# Patient Record
Sex: Female | Born: 1965 | Race: White | Hispanic: No | Marital: Married | Smoking: Never smoker
Health system: Southern US, Community
[De-identification: ages and names within clinical notes are randomized; demographics above are authoritative.]

## PROBLEM LIST (undated history)

## (undated) DIAGNOSIS — F319 Bipolar disorder, unspecified: Secondary | ICD-10-CM

## (undated) DIAGNOSIS — I82409 Acute embolism and thrombosis of unspecified deep veins of unspecified lower extremity: Secondary | ICD-10-CM

## (undated) HISTORY — DX: Bipolar disorder, unspecified: F31.9

## (undated) HISTORY — DX: Acute embolism and thrombosis of unspecified deep veins of unspecified lower extremity: I82.409

---

## 2015-06-14 DIAGNOSIS — Z9119 Patient's noncompliance with other medical treatment and regimen: Secondary | ICD-10-CM | POA: Diagnosis not present

## 2015-06-14 DIAGNOSIS — D6851 Activated protein C resistance: Secondary | ICD-10-CM | POA: Diagnosis not present

## 2015-06-14 DIAGNOSIS — Z7901 Long term (current) use of anticoagulants: Secondary | ICD-10-CM | POA: Diagnosis not present

## 2015-06-14 DIAGNOSIS — F319 Bipolar disorder, unspecified: Secondary | ICD-10-CM | POA: Diagnosis not present

## 2015-07-25 DIAGNOSIS — Z7901 Long term (current) use of anticoagulants: Secondary | ICD-10-CM | POA: Diagnosis not present

## 2015-07-26 DIAGNOSIS — F314 Bipolar disorder, current episode depressed, severe, without psychotic features: Secondary | ICD-10-CM | POA: Diagnosis not present

## 2015-07-26 DIAGNOSIS — F4310 Post-traumatic stress disorder, unspecified: Secondary | ICD-10-CM | POA: Diagnosis not present

## 2015-08-01 DIAGNOSIS — Z7901 Long term (current) use of anticoagulants: Secondary | ICD-10-CM | POA: Diagnosis not present

## 2015-08-01 DIAGNOSIS — D6851 Activated protein C resistance: Secondary | ICD-10-CM | POA: Diagnosis not present

## 2015-08-12 DIAGNOSIS — Z86718 Personal history of other venous thrombosis and embolism: Secondary | ICD-10-CM | POA: Diagnosis not present

## 2015-08-12 DIAGNOSIS — F319 Bipolar disorder, unspecified: Secondary | ICD-10-CM | POA: Diagnosis not present

## 2015-08-12 DIAGNOSIS — D6851 Activated protein C resistance: Secondary | ICD-10-CM | POA: Diagnosis not present

## 2015-08-12 DIAGNOSIS — I82431 Acute embolism and thrombosis of right popliteal vein: Secondary | ICD-10-CM | POA: Diagnosis not present

## 2015-08-12 DIAGNOSIS — I82512 Chronic embolism and thrombosis of left femoral vein: Secondary | ICD-10-CM | POA: Diagnosis not present

## 2015-08-12 DIAGNOSIS — Z7901 Long term (current) use of anticoagulants: Secondary | ICD-10-CM | POA: Diagnosis not present

## 2015-08-12 DIAGNOSIS — Z6841 Body Mass Index (BMI) 40.0 and over, adult: Secondary | ICD-10-CM | POA: Diagnosis not present

## 2015-08-12 DIAGNOSIS — E669 Obesity, unspecified: Secondary | ICD-10-CM | POA: Diagnosis not present

## 2015-08-12 DIAGNOSIS — Z87891 Personal history of nicotine dependence: Secondary | ICD-10-CM | POA: Diagnosis not present

## 2015-09-16 DIAGNOSIS — D6851 Activated protein C resistance: Secondary | ICD-10-CM | POA: Diagnosis not present

## 2015-09-16 DIAGNOSIS — Z5181 Encounter for therapeutic drug level monitoring: Secondary | ICD-10-CM | POA: Diagnosis not present

## 2015-09-16 DIAGNOSIS — Z79811 Long term (current) use of aromatase inhibitors: Secondary | ICD-10-CM | POA: Diagnosis not present

## 2015-09-16 DIAGNOSIS — I87009 Postthrombotic syndrome without complications of unspecified extremity: Secondary | ICD-10-CM | POA: Diagnosis not present

## 2015-09-16 DIAGNOSIS — Z7901 Long term (current) use of anticoagulants: Secondary | ICD-10-CM | POA: Diagnosis not present

## 2015-09-16 DIAGNOSIS — I8290 Acute embolism and thrombosis of unspecified vein: Secondary | ICD-10-CM | POA: Diagnosis not present

## 2015-09-16 DIAGNOSIS — F319 Bipolar disorder, unspecified: Secondary | ICD-10-CM | POA: Diagnosis not present

## 2015-09-16 DIAGNOSIS — Z86718 Personal history of other venous thrombosis and embolism: Secondary | ICD-10-CM | POA: Diagnosis not present

## 2015-10-22 DIAGNOSIS — F4310 Post-traumatic stress disorder, unspecified: Secondary | ICD-10-CM | POA: Diagnosis not present

## 2015-10-22 DIAGNOSIS — F314 Bipolar disorder, current episode depressed, severe, without psychotic features: Secondary | ICD-10-CM | POA: Diagnosis not present

## 2015-11-15 DIAGNOSIS — F4310 Post-traumatic stress disorder, unspecified: Secondary | ICD-10-CM | POA: Diagnosis not present

## 2015-11-15 DIAGNOSIS — F314 Bipolar disorder, current episode depressed, severe, without psychotic features: Secondary | ICD-10-CM | POA: Diagnosis not present

## 2015-12-06 DIAGNOSIS — F314 Bipolar disorder, current episode depressed, severe, without psychotic features: Secondary | ICD-10-CM | POA: Diagnosis not present

## 2015-12-06 DIAGNOSIS — F4310 Post-traumatic stress disorder, unspecified: Secondary | ICD-10-CM | POA: Diagnosis not present

## 2015-12-25 DIAGNOSIS — I872 Venous insufficiency (chronic) (peripheral): Secondary | ICD-10-CM | POA: Diagnosis not present

## 2016-01-27 LAB — HEPATIC FUNCTION PANEL
ALK PHOS: 19 — AB (ref 25–125)
ALT: 21 (ref 7–35)
AST: 19 (ref 13–35)
Bilirubin, Total: 0.5

## 2016-01-27 LAB — VITAMIN D 25 HYDROXY (VIT D DEFICIENCY, FRACTURES): VIT D 25 HYDROXY: 42

## 2016-01-27 LAB — BASIC METABOLIC PANEL
BUN: 11 (ref 4–21)
Creatinine: 0.9 (ref 0.5–1.1)
GLUCOSE: 61
Potassium: 3.9 (ref 3.4–5.3)
SODIUM: 139 (ref 137–147)

## 2016-01-27 LAB — LIPID PANEL
CHOLESTEROL: 162 (ref 0–200)
HDL: 55 (ref 35–70)
LDL CALC: 98
LDl/HDL Ratio: 3
Triglycerides: 130 (ref 40–160)

## 2016-01-27 LAB — CBC AND DIFFERENTIAL
HEMATOCRIT: 39 (ref 36–46)
Hemoglobin: 13.1 (ref 12.0–16.0)
PLATELETS: 188 (ref 150–399)
WBC: 7.9

## 2016-01-27 LAB — TSH: TSH: 0.92 (ref 0.41–5.90)

## 2016-01-27 LAB — VITAMIN B12: Vitamin B-12: 477

## 2016-02-28 DIAGNOSIS — F314 Bipolar disorder, current episode depressed, severe, without psychotic features: Secondary | ICD-10-CM | POA: Diagnosis not present

## 2016-02-28 DIAGNOSIS — F4310 Post-traumatic stress disorder, unspecified: Secondary | ICD-10-CM | POA: Diagnosis not present

## 2016-11-26 DIAGNOSIS — F3131 Bipolar disorder, current episode depressed, mild: Secondary | ICD-10-CM | POA: Diagnosis not present

## 2016-11-26 DIAGNOSIS — F3111 Bipolar disorder, current episode manic without psychotic features, mild: Secondary | ICD-10-CM | POA: Diagnosis not present

## 2017-02-11 ENCOUNTER — Encounter: Payer: Self-pay | Admitting: Internal Medicine

## 2017-02-11 ENCOUNTER — Ambulatory Visit: Payer: BLUE CROSS/BLUE SHIELD | Admitting: Internal Medicine

## 2017-02-11 ENCOUNTER — Other Ambulatory Visit (INDEPENDENT_AMBULATORY_CARE_PROVIDER_SITE_OTHER): Payer: BLUE CROSS/BLUE SHIELD

## 2017-02-11 VITALS — BP 120/80 | HR 80 | Temp 98.3°F | Resp 16 | Ht 74.0 in | Wt 323.0 lb

## 2017-02-11 DIAGNOSIS — D6851 Activated protein C resistance: Secondary | ICD-10-CM

## 2017-02-11 DIAGNOSIS — D6859 Other primary thrombophilia: Secondary | ICD-10-CM

## 2017-02-11 DIAGNOSIS — I87099 Postthrombotic syndrome with other complications of unspecified lower extremity: Secondary | ICD-10-CM | POA: Insufficient documentation

## 2017-02-11 LAB — CBC WITH DIFFERENTIAL/PLATELET
BASOS PCT: 0.4 % (ref 0.0–3.0)
Basophils Absolute: 0 10*3/uL (ref 0.0–0.1)
EOS PCT: 0.1 % (ref 0.0–5.0)
Eosinophils Absolute: 0 10*3/uL (ref 0.0–0.7)
HEMATOCRIT: 39.9 % (ref 36.0–46.0)
HEMOGLOBIN: 12.9 g/dL (ref 12.0–15.0)
LYMPHS PCT: 16.1 % (ref 12.0–46.0)
Lymphs Abs: 1.1 10*3/uL (ref 0.7–4.0)
MCHC: 32.5 g/dL (ref 30.0–36.0)
MCV: 88.3 fl (ref 78.0–100.0)
MONO ABS: 0.4 10*3/uL (ref 0.1–1.0)
Monocytes Relative: 6.4 % (ref 3.0–12.0)
Neutro Abs: 5.1 10*3/uL (ref 1.4–7.7)
Neutrophils Relative %: 77 % (ref 43.0–77.0)
PLATELETS: 178 10*3/uL (ref 150.0–400.0)
RBC: 4.52 Mil/uL (ref 3.87–5.11)
RDW: 15.4 % (ref 11.5–15.5)
WBC: 6.7 10*3/uL (ref 4.0–10.5)

## 2017-02-11 LAB — PROTIME-INR
INR: 2.2 ratio — AB (ref 0.8–1.0)
Prothrombin Time: 23.1 s — ABNORMAL HIGH (ref 9.6–13.1)

## 2017-02-11 LAB — BASIC METABOLIC PANEL
BUN: 11 mg/dL (ref 6–23)
CHLORIDE: 109 meq/L (ref 96–112)
CO2: 26 meq/L (ref 19–32)
Calcium: 9.3 mg/dL (ref 8.4–10.5)
Creatinine, Ser: 0.94 mg/dL (ref 0.40–1.20)
GFR: 67.21 mL/min (ref >60.00)
Glucose, Bld: 88 mg/dL (ref 70–99)
Potassium: 3.8 mEq/L (ref 3.5–5.1)
Sodium: 141 mEq/L (ref 135–145)

## 2017-02-11 MED ORDER — RIVAROXABAN 20 MG PO TABS: 20.0000 mg | ORAL_TABLET | Freq: Every day | ORAL | 1 refills | Status: DC

## 2017-02-11 MED ORDER — APIXABAN 5 MG PO TABS: 5.0000 mg | ORAL_TABLET | Freq: Two times a day (BID) | ORAL | 1 refills | Status: DC

## 2017-02-11 NOTE — Progress Notes (Addendum)
Subjective:  Patient ID: Lynn [name] - [name], female    DOB: 09/24/1965  Age: 52 y.o. MRN: [id number]  CC: Coagulation Disorder  NEW TO ME  HPI Lynn [name] - Lynn [name] presents for establishing with a new PCP.  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  She was previously and successfully maintained on Eliquis but then over the last year Eliquis became a financial burden so she has been treated with warfarin.  She complains of the inconvenience of monitoring the INR with warfarin and wants to go back to Eliquis.  She thinks her new insurance this will year will cover Eliquis.  She has chronic swelling in her left lower extremity but says it is not any worse recently.  She denies lower extremity pain or claudication.  She has had no recent episodes of chest pain, shortness of breath, headache, easy bruising, or bleeding.  She has never had a mammogram or Pap smear.  She refuses all health maintenance interventions today such as vaccines, mammogram, and Pap smear.  History Lynn LeashDonna has a past medical history of Bipolar 1 disorder (HCC) and DVT (deep venous thrombosis) (HCC).   She has no past surgical history on file.   Her family history includes CVA in her mother; Depression in her sister; Diabetes in her mother and sister; Drug abuse in her sister; Hypertension in her father.She reports that  has never smoked. she has never used smokeless tobacco. She reports that she does not drink alcohol or use drugs.  Outpatient Medications Prior to Visit  Medication Sig Dispense Refill  . amantadine (SYMMETREL) 100 MG capsule Take by mouth.    . gabapentin (NEURONTIN) 600 MG tablet Take 600 mg by mouth daily.    Marland Kitchen. lithium carbonate (LITHOBID) 300 MG CR tablet Take by mouth.    . OLANZAPINE-FLUOXETINE HCL PO Take 15-50 mg by mouth.    . QUEtiapine (SEROQUEL) 100 MG tablet Take by mouth.    . zolpidem (AMBIEN) 10 MG tablet Take by mouth.    .  warfarin (COUMADIN) 5 MG tablet Take as directed at last coumadin check     No facility-administered medications prior to visit.     ROS Review of Systems  Constitutional: Negative.  Negative for fatigue and unexpected weight change.  HENT: Negative.   Eyes: Negative.  Negative for visual disturbance.  Respiratory: Negative.  Negative for cough, chest tightness, shortness of breath and wheezing.   Cardiovascular: Positive for leg swelling. Negative for chest pain and palpitations.       She has chronic, unchanged swelling in her left lower extremity.  There is no swelling in her right lower extremity.  Gastrointestinal: Negative for abdominal pain, blood in stool, constipation, diarrhea, nausea and vomiting.  Endocrine: Negative.   Genitourinary: Negative for hematuria and vaginal bleeding.  Musculoskeletal: Negative.   Skin: Negative.  Negative for pallor.  Allergic/Immunologic: Negative.   Neurological: Negative.  Negative for dizziness and weakness.  Hematological: Negative for adenopathy. Does not bruise/bleed easily.  Psychiatric/Behavioral: Negative.  Negative for dysphoric mood and suicidal ideas. The patient is not nervous/anxious.     Objective:  BP 120/80 (BP Location: Left Arm, Patient Position: Sitting, Cuff Size: Large)   Pulse 80   Temp 98.3 F (36.8 C) (Oral)   Resp 16   Ht 6\' 2"  (1.88 m)   Wt (!) 323 lb (146.5 kg)   LMP 02/09/2017   SpO2 97%   BMI  41.47 kg/m   Physical Exam  Constitutional: She is oriented to person, place, and time. No distress.  HENT:  Mouth/Throat: Oropharynx is clear and moist. No oropharyngeal exudate.  Eyes: Conjunctivae are normal. Left eye exhibits no discharge. No scleral icterus.  Neck: Normal range of motion. Neck supple. No JVD present. No thyromegaly present.  Cardiovascular: Normal rate, regular rhythm and normal heart sounds. Exam reveals no gallop.  No murmur heard. Pulmonary/Chest: Effort normal and breath sounds normal.  No respiratory distress. She has no wheezes. She has no rales.  Abdominal: Soft. Bowel sounds are normal. She exhibits no mass. There is no tenderness.  Musculoskeletal: Normal range of motion. She exhibits edema. She exhibits no tenderness.  ++ non-pitting edema in the LLE No edema in the RLE  Lymphadenopathy:    She has no cervical adenopathy.  Neurological: She is alert and oriented to person, place, and time.  Skin: Skin is warm and dry. No rash noted. She is not diaphoretic. No erythema. No pallor.  Psychiatric: She has a normal mood and affect. Her behavior is normal. Judgment and thought content normal.  Vitals reviewed.  Lab Results  Component Value Date   WBC 6.7 02/11/2017   HGB 12.9 02/11/2017   HCT 39.9 02/11/2017   PLT 178.0 02/11/2017   GLUCOSE 88 02/11/2017   CHOL 162 01/27/2016   TRIG 130 01/27/2016   HDL 55 01/27/2016   LDLCALC 98 01/27/2016   ALT 21 01/27/2016   AST 19 01/27/2016   NA 141 02/11/2017   K 3.8 02/11/2017   CL 109 02/11/2017   CREATININE 0.94 02/11/2017   BUN 11 02/11/2017   CO2 26 02/11/2017   TSH 0.92 01/27/2016   INR 2.2 (H) 02/11/2017     Assessment & Plan:   Biridiana was seen today for coagulation disorder.  Diagnoses and all orders for this visit:  Factor 5 Leiden mutation, heterozygous (HCC)- as below -     CBC with Differential/Platelet; Future -     Basic metabolic panel; Future -     Protime-INR; Future -     Discontinue: rivaroxaban (XARELTO) 20 MG TABS tablet; Take 1 tablet (20 mg total) by mouth daily with supper. -     apixaban (ELIQUIS) 5 MG TABS tablet; Take 1 tablet (5 mg total) by mouth 2 (two) times daily.  Primary hypercoagulable state (HCC)- she is currently anticoagulated with an INR of 2.2.  I have asked her to stop Coumadin and in 2 days to start Eliquis. -     CBC with Differential/Platelet; Future -     Basic metabolic panel; Future -     Protime-INR; Future -     Discontinue: rivaroxaban (XARELTO) 20 MG TABS  tablet; Take 1 tablet (20 mg total) by mouth daily with supper. -     apixaban (ELIQUIS) 5 MG TABS tablet; Take 1 tablet (5 mg total) by mouth 2 (two) times daily.  Postphlebitic syndrome with complication -     Discontinue: rivaroxaban (XARELTO) 20 MG TABS tablet; Take 1 tablet (20 mg total) by mouth daily with supper.   I have discontinued Arnetia Bronk - [name]'s warfarin and rivaroxaban. I am also having her start on apixaban. Additionally, I am having her maintain her amantadine, zolpidem, QUEtiapine, lithium carbonate, gabapentin, and OLANZAPINE-FLUOXETINE HCL PO.  Meds ordered this encounter  Medications  . DISCONTD: rivaroxaban (XARELTO) 20 MG TABS tablet    Sig: Take 1 tablet (20 mg total) by mouth daily with supper.  Dispense:  90 tablet    Refill:  1  . apixaban (ELIQUIS) 5 MG TABS tablet    Sig: Take 1 tablet (5 mg total) by mouth 2 (two) times daily.    Dispense:  180 tablet    Refill:  1     Follow-up: Return in about 6 months (around 08/11/2017).  Sanda Linger, MD

## 2017-02-11 NOTE — Patient Instructions (Signed)
Factor V Deficiency Factor V deficiency is a rare genetic condition that causes problems with the way your blood clots. This means that it is harder for your body to stop bleeding, especially after surgery or injury. Factor V is a protein in the blood that helps your blood to clot (blood coagulation factor). If you have a factor V deficiency, your body does not produce enough of this protein, or the protein does not work the way it should. Most cases of factor V deficiency are not severe. What are the causes? Factor V deficiency is almost always caused by a defective factor V gene passed down (inherited) from both parents. In rare cases, the body can develop an antibody that causes factor V deficiency. This can occur after certain medical procedures or after giving birth. It can also happen if you have an autoimmune disease or cancer. What increases the risk? You may be at higher risk for factor V deficiency if you have:  A family history of the condition.  A family history of bleeding disorders.  An autoimmune disease.  Certain cancers.  What are the signs or symptoms? Most symptoms of factor V deficiency involve heavy or abnormal bleeding. They include:  Abnormal bleeding after injury, surgery, or childbirth.  Bleeding under the skin.  Bleeding gums.  Frequent nosebleeds.  Bruising.  Menstrual periods that are unusually long and heavy.  Internal bleeding.  Bleeding from the umbilical cord at birth.  How is this diagnosed? Your health care provider may suspect factor V deficiency if you have symptoms of the condition as well as a family or personal history of bleeding problems. A physical exam will be done. Blood tests may also be done to help make a diagnosis. These tests may include:  Factor assays. These tests are used to check the level of certain clotting factors and how well they work.  Prothrombin time and partial prothrombin time. This test measures how long it takes  your blood to clot.  Inhibitor tests. These tests determine whether your body's immune system affects clotting factors.  How is this treated? Treatment for factor V deficiency may include the following:  Blood transfusions of fresh frozen plasma (FFP) may be done if you have severe bleeding. This may also be done as a precaution whenever you have surgery.  A nasal spray that raises factor levels (desmopressin) may be used before any medical procedures.  Birth control pills may be used to control heavy menstrual bleeding.  Follow these instructions at home:  Be extra careful to avoid injuries or accidents.  Take medicines only as directed by your health care provider.  Tell all of your health care providers that you have factor V deficiency, especially before having a medical or dental procedure.  Wear a medical alert bracelet in case of emergency.  Take steps at home to reduce the risk of bleeding, such as using a soft toothbrush and an Neurosurgeonelectric razor.  Include plenty of fiber in your diet to prevent constipation and reduce your risk for rectal bleeding.  Do not use enemas or rectal thermometers. Contact a health care provider if:  You have excessive or persistent bleeding.  Your skin bruises very easily.  Your menstrual periods are very heavy or last longer than normal. Get help right away if: You have unusual or severe blood loss. This information is not intended to replace advice given to you by your health care provider. Make sure you discuss any questions you have with your health care  provider. Document Released: 08/09/2013 Document Revised: 06/20/2015 Document Reviewed: 06/12/2013 Elsevier Interactive Patient Education  [employer]2018 Elsevier Inc.

## 2017-02-14 ENCOUNTER — Encounter: Payer: Self-pay | Admitting: Internal Medicine

## 2017-02-18 ENCOUNTER — Telehealth: Payer: Self-pay | Admitting: Internal Medicine

## 2017-02-18 DIAGNOSIS — D6859 Other primary thrombophilia: Secondary | ICD-10-CM

## 2017-02-18 DIAGNOSIS — D6851 Activated protein C resistance: Secondary | ICD-10-CM

## 2017-02-18 MED ORDER — APIXABAN 5 MG PO TABS: 5.0000 mg | ORAL_TABLET | Freq: Two times a day (BID) | ORAL | 5 refills | Status: DC

## 2017-02-18 NOTE — Telephone Encounter (Signed)
[facility]Raechel Chute/lmb

## 2017-02-18 NOTE — Telephone Encounter (Signed)
Copied from CRM [id number]. Topic: Quick Communication - See Telephone Encounter >> Feb 18, 2017 11:01 AM Eston Mouldavis, Valinda Fedie B wrote: CRM for notification. See Telephone encounter for:  PT states she needs rx for apixaban (ELIQUIS) 5 MG TABS table written  for 30 day supply  instead of 90 days   [facility] [address] - [place], [place] - [address] [phone] (Phone) [phone] (Fax)    02/18/17.

## 2017-05-27 DIAGNOSIS — F3131 Bipolar disorder, current episode depressed, mild: Secondary | ICD-10-CM | POA: Diagnosis not present

## 2017-07-01 DIAGNOSIS — F3176 Bipolar disorder, in full remission, most recent episode depressed: Secondary | ICD-10-CM | POA: Diagnosis not present

## 2017-07-01 DIAGNOSIS — F410 Panic disorder [episodic paroxysmal anxiety] without agoraphobia: Secondary | ICD-10-CM | POA: Diagnosis not present

## 2017-07-01 DIAGNOSIS — F3174 Bipolar disorder, in full remission, most recent episode manic: Secondary | ICD-10-CM | POA: Diagnosis not present

## 2017-11-28 ENCOUNTER — Other Ambulatory Visit: Payer: Self-pay | Admitting: Internal Medicine

## 2017-11-28 DIAGNOSIS — D6851 Activated protein C resistance: Secondary | ICD-10-CM

## 2017-11-28 DIAGNOSIS — D6859 Other primary thrombophilia: Secondary | ICD-10-CM

## 2017-12-30 DIAGNOSIS — F3181 Bipolar II disorder: Secondary | ICD-10-CM | POA: Diagnosis not present

## 2017-12-30 DIAGNOSIS — F3174 Bipolar disorder, in full remission, most recent episode manic: Secondary | ICD-10-CM | POA: Diagnosis not present

## 2017-12-30 DIAGNOSIS — F410 Panic disorder [episodic paroxysmal anxiety] without agoraphobia: Secondary | ICD-10-CM | POA: Diagnosis not present

## 2018-01-24 ENCOUNTER — Encounter: Payer: Self-pay | Admitting: Internal Medicine

## 2018-01-24 ENCOUNTER — Ambulatory Visit: Payer: BLUE CROSS/BLUE SHIELD | Admitting: Internal Medicine

## 2018-01-24 ENCOUNTER — Ambulatory Visit: Payer: Self-pay

## 2018-01-24 ENCOUNTER — Ambulatory Visit (INDEPENDENT_AMBULATORY_CARE_PROVIDER_SITE_OTHER)
Admission: RE | Admit: 2018-01-24 | Discharge: 2018-01-24 | Disposition: A | Discharge status: Home / Self Care | Payer: BLUE CROSS/BLUE SHIELD | Source: Ambulatory Visit | Attending: Internal Medicine | Admitting: Internal Medicine

## 2018-01-24 ENCOUNTER — Other Ambulatory Visit (INDEPENDENT_AMBULATORY_CARE_PROVIDER_SITE_OTHER): Payer: BLUE CROSS/BLUE SHIELD

## 2018-01-24 ENCOUNTER — Other Ambulatory Visit: Payer: Self-pay | Admitting: Internal Medicine

## 2018-01-24 VITALS — BP 118/60 | HR 99 | Temp 98.4°F | Resp 16 | Ht 74.0 in | Wt 283.0 lb

## 2018-01-24 DIAGNOSIS — R059 Cough, unspecified: Secondary | ICD-10-CM

## 2018-01-24 DIAGNOSIS — J181 Lobar pneumonia, unspecified organism: Secondary | ICD-10-CM | POA: Diagnosis not present

## 2018-01-24 DIAGNOSIS — J189 Pneumonia, unspecified organism: Secondary | ICD-10-CM

## 2018-01-24 DIAGNOSIS — D6859 Other primary thrombophilia: Secondary | ICD-10-CM

## 2018-01-24 DIAGNOSIS — R05 Cough: Secondary | ICD-10-CM

## 2018-01-24 LAB — CBC WITH DIFFERENTIAL/PLATELET
Basophils Absolute: 0.1 10*3/uL (ref 0.0–0.1)
Basophils Relative: 0.7 % (ref 0.0–3.0)
Eosinophils Absolute: 0.1 10*3/uL (ref 0.0–0.7)
Eosinophils Relative: 0.9 % (ref 0.0–5.0)
HEMATOCRIT: 41.6 % (ref 36.0–46.0)
Hemoglobin: 13.5 g/dL (ref 12.0–15.0)
LYMPHS ABS: 1.5 10*3/uL (ref 0.7–4.0)
LYMPHS PCT: 10.8 % — AB (ref 12.0–46.0)
MCHC: 32.6 g/dL (ref 30.0–36.0)
MCV: 87 fl (ref 78.0–100.0)
MONOS PCT: 6.6 % (ref 3.0–12.0)
Monocytes Absolute: 0.9 10*3/uL (ref 0.1–1.0)
NEUTROS ABS: 11.2 10*3/uL — AB (ref 1.4–7.7)
NEUTROS PCT: 81 % — AB (ref 43.0–77.0)
PLATELETS: 300 10*3/uL (ref 150.0–400.0)
RBC: 4.78 Mil/uL (ref 3.87–5.11)
RDW: 14.4 % (ref 11.5–15.5)
WBC: 13.8 10*3/uL — ABNORMAL HIGH (ref 4.0–10.5)

## 2018-01-24 LAB — BASIC METABOLIC PANEL
BUN: 14 mg/dL (ref 6–23)
CO2: 24 mEq/L (ref 19–32)
CREATININE: 1.27 mg/dL — AB (ref 0.40–1.20)
Calcium: 10.5 mg/dL (ref 8.4–10.5)
Chloride: 103 mEq/L (ref 96–112)
GFR: 47.31 mL/min — ABNORMAL LOW (ref >60.00)
Glucose, Bld: 104 mg/dL — ABNORMAL HIGH (ref 70–99)
Potassium: 4 mEq/L (ref 3.5–5.1)
Sodium: 137 mEq/L (ref 135–145)

## 2018-01-24 MED ORDER — AZITHROMYCIN 500 MG PO TABS: 500.0000 mg | ORAL_TABLET | Freq: Every day | ORAL | 0 refills | Status: AC

## 2018-01-24 MED ORDER — LEVOFLOXACIN 750 MG PO TABS: 750.0000 mg | ORAL_TABLET | Freq: Every day | ORAL | 0 refills | Status: DC

## 2018-01-24 MED ORDER — PROMETHAZINE HCL 12.5 MG PO TABS: 12.5000 mg | ORAL_TABLET | Freq: Four times a day (QID) | ORAL | 0 refills | Status: DC | PRN

## 2018-01-24 MED ORDER — CEFDINIR 300 MG PO CAPS: 300.0000 mg | ORAL_CAPSULE | Freq: Two times a day (BID) | ORAL | 0 refills | Status: AC

## 2018-01-24 MED ORDER — HYDROCODONE-HOMATROPINE 5-1.5 MG/5ML PO SYRP: 5.0000 mL | ORAL_SOLUTION | Freq: Three times a day (TID) | ORAL | 0 refills | Status: DC | PRN

## 2018-01-24 MED ORDER — PROMETHAZINE-DM 6.25-15 MG/5ML PO SYRP: 5.0000 mL | ORAL_SOLUTION | Freq: Four times a day (QID) | ORAL | 0 refills | Status: DC | PRN

## 2018-01-24 NOTE — Telephone Encounter (Signed)
    1  Lynn [name] [facility]- [name] Female, 52 y.o., 12/07/1965 MRN:  [id number] Phone:  [phone] [name](M) PCP:  Etta GrandchildJones, Thomas L, MD Coverage:  Valinda HoarBLUE CROSS BLUE SHIELD/BCBS OTHER Next Appt With Internal Medicine 02/17/2018 at 2:30 PM Message from Darron DoomKaren A Yeudiel Mateo sent at 01/24/2018 11:31 AM EST   Tresa EndoKelly with [facility] called to say that there is a drug interaction warning with levofloxacin (LEVAQUIN) 750 MG tablet and QUEtiapine (SEROQUEL) 100 MG tablet. Also request an alternative for promethazine-dextromethorphan (PROMETHAZINE-DM) 6.25-15 MG/5ML syrup medication is not available to be ordered anywhere. Please advise Ph# [phone]  Call History    Type Contact Phone User  01/24/2018 11:27 AM Phone (Incoming) [facility] [address] - [place], [place] - [address] (Pharmacy) [phone] Darron Doomavis, Karen A

## 2018-01-24 NOTE — Progress Notes (Signed)
Subjective:  Patient ID: Lynn Lynn [name], female    DOB: 1965-09-19  Age: 52 y.o. MRN: [id number]  CC: Cough   HPI Lynn Lynn [name] presents for a 5-day history of cough productive of thick green mucus.  She iha also had night sweats and a few episodes of nausea and diarrhea.  Outpatient Medications Prior to Visit  Medication Sig Dispense Refill  . Lynn [name] (SYMMETREL) 100 MG capsule Take by mouth.    Lynn Lynn [name] 5 MG TABS tablet TAKE ONE TABLET BY MOUTH TWICE A DAY 60 tablet 4  . Lynn Lynn [name] (NEURONTIN) 600 MG tablet Take 600 mg by mouth daily.    Marland Kitchen Lynn Lynn [name] (LITHOBID) 300 MG CR tablet Take by mouth.    Marland Kitchen LORazepam (ATIVAN) 1 MG tablet Take 1 mg by mouth 2 (two) times daily.    . Lynn Lynn [name] (LATUDA) 60 MG TABS Take 1 tablet by mouth daily.    . Lynn Lynn [name] (SEROQUEL) 100 MG tablet Take by mouth.    . Lynn Lynn [name] (AMBIEN) 10 MG tablet Take by mouth.    . Lynn Lynn [name] Lynn [name] Take 15-50 mg by mouth.     No facility-administered medications prior to visit.     ROS Review of Systems  Constitutional: Positive for chills and fever. Negative for diaphoresis and fatigue.  HENT: Positive for sore throat. Negative for trouble swallowing.   Respiratory: Positive for cough and shortness of breath. Negative for chest tightness, wheezing and stridor.   Cardiovascular: Negative for chest pain, palpitations and leg swelling.  Gastrointestinal: Positive for diarrhea and nausea. Negative for abdominal pain.  Endocrine: Negative.   Genitourinary: Negative.   Musculoskeletal: Negative.  Negative for arthralgias and myalgias.  Skin: Negative.  Negative for color change and rash.  Neurological: Negative.  Negative for dizziness, weakness and headaches.  Hematological: Negative for adenopathy. Does not bruise/bleed easily.  Psychiatric/Behavioral: Negative.     Objective:  BP 118/60   Pulse 99   Temp 98.4 F (36.9 C) (Oral)   Resp 16   Ht 6\' 2"  (1.88 m)   Wt  283 lb (128.4 kg)   LMP 01/02/2018   SpO2 96%   BMI 36.34 kg/m   BP Readings from Last 3 Encounters:  01/24/18 118/60  02/11/17 120/80    Wt Readings from Last 3 Encounters:  01/24/18 283 lb (128.4 kg)  02/11/17 (!) 323 lb (146.5 kg)    Physical Exam Vitals signs reviewed.  Constitutional:      General: She is not in acute distress.    Appearance: Normal appearance. She is not ill-appearing, toxic-appearing or diaphoretic.  HENT:     Nose: Nose normal. No congestion.     Mouth/Throat:     Mouth: Mucous membranes are moist.     Pharynx: No posterior oropharyngeal erythema.  Eyes:     Conjunctiva/sclera: Conjunctivae normal.  Neck:     Musculoskeletal: Normal range of motion and neck supple. No neck rigidity.  Cardiovascular:     Rate and Rhythm: Normal rate and regular rhythm.     Pulses: Normal pulses.     Heart sounds: Normal heart sounds. No murmur. No gallop.   Pulmonary:     Effort: Pulmonary effort is normal.     Breath sounds: Normal breath sounds. No stridor. No wheezing, rhonchi or rales.  Abdominal:     General: Abdomen is flat.     Palpations: There is no hepatomegaly, splenomegaly or mass.     Tenderness: There is no abdominal  tenderness.  Musculoskeletal: Normal range of motion.        General: No tenderness or deformity.  Lymphadenopathy:     Cervical: No cervical adenopathy.  Skin:    General: Skin is warm and dry.  Neurological:     General: No focal deficit present.     Mental Status: She is alert and oriented to person, place, and time.  Psychiatric:        Mood and Affect: Mood normal.     Lab Results  Component Value Date   WBC 13.8 (H) 01/24/2018   HGB 13.5 01/24/2018   HCT 41.6 01/24/2018   PLT 300.0 01/24/2018   GLUCOSE 104 (H) 01/24/2018   CHOL 162 01/27/2016   TRIG 130 01/27/2016   HDL 55 01/27/2016   LDLCALC 98 01/27/2016   ALT 21 01/27/2016   AST 19 01/27/2016   NA 137 01/24/2018   K 4.0 01/24/2018   CL 103 01/24/2018    CREATININE 1.27 (H) 01/24/2018   BUN 14 01/24/2018   CO2 24 01/24/2018   TSH 0.92 01/27/2016   INR 2.2 (H) 02/11/2017    Dg Chest 2 View  Result Date: 01/24/2018 CLINICAL DATA:  52 year old with cough. EXAM: CHEST - 2 VIEW COMPARISON:  None. FINDINGS: Patchy densities in the left lung, particularly left upper lobe. Findings are suggestive for pneumonia. The right lung is clear. Heart and mediastinum are within normal limits. No pleural effusions. Degenerative endplate changes in the thoracic spine. Trachea is midline. IMPRESSION: Patchy densities in left lung and most compatible with pneumonia. Followup PA and lateral chest X-ray is recommended in 3-4 weeks following trial of antibiotic therapy to ensure resolution and exclude underlying malignancy. Electronically Signed   By: Richarda OverlieAdam  Henn M.D.   On: 01/24/2018 11:07    Assessment & Plan:   Lynn Lynn [name] was seen today for cough.  Diagnoses and all orders for this visit:  Primary hypercoagulable state (HCC)- Her creatinine clearance is normal at 82.  She will remain on the current dose of Lynn Lynn [name]. -     Basic metabolic panel; Future -     CBC with Differential/Platelet; Future  Cough- Her chest x-ray is positive for infiltrates on the left side. -     DG Chest 2 View; Future  Pneumonia of lower lobe due to infectious organism, unspecified laterality (HCC)- Lynn will treat her for typical and atypical causes with levofloxacin.  Will control her symptoms with Promethazine DM as needed. -     levofloxacin (LEVAQUIN) 750 MG tablet; Take 1 tablet (750 mg total) by mouth daily for 7 days. -     promethazine-dextromethorphan (PROMETHAZINE-DM) 6.25-15 MG/5ML syrup; Take 5 mLs by mouth 4 (four) times daily as needed for up to 7 days for cough.   Lynn am having Lynn Lynn [name] start on levofloxacin and promethazine-dextromethorphan. Lynn am also having her maintain her Lynn [name], Lynn Lynn [name], Lynn Lynn [name], Lynn Lynn [name], Lynn Lynn [name], Lynn Lynn [name], Lynn Lynn [name], Lynn Lynn [name], and LORazepam.  Meds ordered this encounter  Medications  . levofloxacin (LEVAQUIN) 750 MG tablet    Sig: Take 1 tablet (750 mg total) by mouth daily for 7 days.    Dispense:  7 tablet    Refill:  0  . promethazine-dextromethorphan (PROMETHAZINE-DM) 6.25-15 MG/5ML syrup    Sig: Take 5 mLs by mouth 4 (four) times daily as needed for up to 7 days for cough.    Dispense:  240 mL    Refill:  0     Follow-up: Return  in about 3 weeks (around 02/14/2018).  Sanda Lingerhomas Bay Jarquin, MD

## 2018-01-24 NOTE — Telephone Encounter (Signed)
Routing to dr jones, please advise, thanks 

## 2018-01-24 NOTE — Patient Instructions (Addendum)
Community-Acquired Pneumonia, Adult  Pneumonia is an infection of the lungs. It causes swelling in the airways of the lungs. Mucus and fluid may also build up inside the airways.  One type of pneumonia can happen while a person is in a hospital. A different type can happen when a person is not in a hospital (community-acquired pneumonia).   What are the causes?    This condition is caused by germs (viruses, bacteria, or fungi). Some types of germs can be passed from one person to another. This can happen when you breathe in droplets from the cough or sneeze of an infected person.  What increases the risk?  You are more likely to develop this condition if you:   Have a long-term (chronic) disease, such as:  ? Chronic obstructive pulmonary disease (COPD).  ? Asthma.  ? Cystic fibrosis.  ? Congestive heart failure.  ? Diabetes.  ? Kidney disease.   Have HIV.   Have sickle cell disease.   Have had your spleen removed.   Do not take good care of your teeth and mouth (poor dental hygiene).   Have a medical condition that increases the risk of breathing in droplets from your own mouth and nose.   Have a weakened body defense system (immune system).   Are a smoker.   Travel to areas where the germs that cause this illness are common.   Are around certain animals or the places they live.  What are the signs or symptoms?   A dry cough.   A wet (productive) cough.   Fever.   Sweating.   Chest pain. This often happens when breathing deeply or coughing.   Fast breathing or trouble breathing.   Shortness of breath.   Shaking chills.   Feeling tired (fatigue).   Muscle aches.  How is this treated?  Treatment for this condition depends on many things. Most adults can be treated at home. In some cases, treatment must happen in a hospital. Treatment may include:   Medicines given by mouth or through an IV tube.   Being given extra oxygen.   Respiratory therapy.  In rare cases, treatment for very bad pneumonia  may include:   Using a machine to help you breathe.   Having a procedure to remove fluid from around your lungs.  Follow these instructions at home:  Medicines   Take over-the-counter and prescription medicines only as told by your doctor.  ? Only take cough medicine if you are losing sleep.   If you were prescribed an antibiotic medicine, take it as told by your doctor. Do not stop taking the antibiotic even if you start to feel better.  General instructions     Sleep with your head and neck raised (elevated). You can do this by sleeping in a recliner or by putting a few pillows under your head.   Rest as needed. Get at least 8 hours of sleep each night.   Drink enough water to keep your pee (urine) pale yellow.   Eat a healthy diet that includes plenty of vegetables, fruits, whole grains, low-fat dairy products, and lean protein.   Do not use any products that contain nicotine or tobacco. These include cigarettes, e-cigarettes, and chewing tobacco. If you need help quitting, ask your doctor.   Keep all follow-up visits as told by your doctor. This is important.  How is this prevented?  A shot (vaccine) can help prevent pneumonia. Shots are often suggested for:   People   older than 50 years of age.   People older than 50 years of age who:  ? Are having cancer treatment.  ? Have long-term (chronic) lung disease.  ? Have problems with their body's defense system.  You may also prevent pneumonia if you take these actions:   Get the flu (influenza) shot every year.   Go to the dentist as often as told.   Wash your hands often. If you cannot use soap and water, use hand sanitizer.  Contact a doctor if:   You have a fever.   You lose sleep because your cough medicine does not help.  Get help right away if:   You are short of breath and it gets worse.   You have more chest pain.   Your sickness gets worse. This is very serious if:  ? You are an older adult.  ? Your body's defense system is weak.   You  cough up blood.  Summary   Pneumonia is an infection of the lungs.   Most adults can be treated at home. Some will need treatment in a hospital.   Drink enough water to keep your pee pale yellow.   Get at least 8 hours of sleep each night.  This information is not intended to replace advice given to you by your health care provider. Make sure you discuss any questions you have with your health care provider.  Document Released: 07/01/2007 Document Revised: 09/09/2017 Document Reviewed: 09/09/2017  Elsevier Interactive Patient Education  2019 Elsevier Inc.

## 2018-02-17 ENCOUNTER — Encounter: Payer: Self-pay | Admitting: Internal Medicine

## 2018-02-17 ENCOUNTER — Ambulatory Visit: Payer: BLUE CROSS/BLUE SHIELD | Admitting: Internal Medicine

## 2018-02-17 ENCOUNTER — Ambulatory Visit (INDEPENDENT_AMBULATORY_CARE_PROVIDER_SITE_OTHER)
Admission: RE | Admit: 2018-02-17 | Discharge: 2018-02-17 | Disposition: A | Discharge status: Home / Self Care | Payer: BLUE CROSS/BLUE SHIELD | Source: Ambulatory Visit | Attending: Internal Medicine | Admitting: Internal Medicine

## 2018-02-17 VITALS — BP 110/70 | HR 85 | Temp 98.6°F | Resp 16 | Ht 74.0 in | Wt 286.0 lb

## 2018-02-17 DIAGNOSIS — J181 Lobar pneumonia, unspecified organism: Secondary | ICD-10-CM

## 2018-02-17 DIAGNOSIS — J189 Pneumonia, unspecified organism: Secondary | ICD-10-CM

## 2018-02-17 NOTE — Progress Notes (Signed)
Subjective:  Patient ID: Lynn [name], female    DOB: Jul 23, 1965  Age: 53 y.o. MRN: [id number]  CC: Follow-up   HPI Lynn [name] presents for f/up - She has completed the antibiotic regimen for treatment of pneumonia.  She tells me that all of her symptoms have resolved.  Outpatient Medications Prior to Visit  Medication Sig Dispense Refill  . amantadine (SYMMETREL) 100 MG capsule Take by mouth.    Lynn [name] 5 MG TABS tablet TAKE ONE TABLET BY MOUTH TWICE A DAY 60 tablet 4  . gabapentin (NEURONTIN) 600 MG tablet Take 600 mg by mouth daily.    Lynn [name] Kitchen lithium carbonate (LITHOBID) 300 MG CR tablet Take by mouth.    Lynn [name] Kitchen LORazepam (ATIVAN) 1 MG tablet Take 1 mg by mouth 2 (two) times daily.    Lynn [name] Kitchen OLANZAPINE-FLUOXETINE HCL PO Take 15-50 mg by mouth.    . QUEtiapine (SEROQUEL) 100 MG tablet Take by mouth.    Lynn [name] Kitchen HYDROcodone-homatropine (HYCODAN) 5-1.5 MG/5ML syrup Take 5 mLs by mouth every 8 (eight) hours as needed for cough. 120 mL 0  . Lurasidone HCl (LATUDA) 60 MG TABS Take 1 tablet by mouth daily.    . promethazine (PHENERGAN) 12.5 MG tablet Take 1 tablet (12.5 mg total) by mouth every 6 (six) hours as needed for nausea or vomiting. 20 tablet 0  . zolpidem (AMBIEN) 10 MG tablet Take by mouth.     No facility-administered medications prior to visit.     ROS Review of Systems  Constitutional: Negative for chills, fatigue and fever.  HENT: Negative.  Negative for sore throat.   Respiratory: Negative for cough, chest tightness, shortness of breath and wheezing.   Cardiovascular: Negative for chest pain, palpitations and leg swelling.  Gastrointestinal: Negative for abdominal pain, constipation, diarrhea, nausea and vomiting.  Endocrine: Negative.   Genitourinary: Negative.   Musculoskeletal: Negative.   Skin: Negative for color change and rash.  Neurological: Negative.  Negative for dizziness and weakness.  Hematological: Negative for adenopathy. Does not bruise/bleed  easily.  Psychiatric/Behavioral: Negative.     Objective:  BP 110/70 (BP Location: Left Arm, Patient Position: Sitting, Cuff Size: Large)   Pulse 85   Temp 98.6 F (37 C) (Oral)   Ht 6\' 2"  (1.88 m)   Wt 286 lb (129.7 kg)   LMP 02/08/2018   SpO2 98%   BMI 36.72 kg/m   BP Readings from Last 3 Encounters:  02/17/18 110/70  01/24/18 118/60  02/11/17 120/80    Wt Readings from Last 3 Encounters:  02/17/18 286 lb (129.7 kg)  01/24/18 283 lb (128.4 kg)  02/11/17 (!) 323 lb (146.5 kg)    Physical Exam Vitals signs reviewed.  Constitutional:      General: She is not in acute distress.    Appearance: She is obese. She is not ill-appearing, toxic-appearing or diaphoretic.  HENT:     Nose: No congestion or rhinorrhea.     Mouth/Throat:     Mouth: Mucous membranes are moist.     Pharynx: Oropharynx is clear. No oropharyngeal exudate.  Eyes:     General: No scleral icterus.    Conjunctiva/sclera: Conjunctivae normal.  Neck:     Musculoskeletal: Normal range of motion and neck supple. No neck rigidity.  Cardiovascular:     Rate and Rhythm: Normal rate and regular rhythm.     Heart sounds: No murmur. No gallop.   Pulmonary:     Effort: Pulmonary effort is normal.  Breath sounds: No stridor. No wheezing, rhonchi or rales.  Abdominal:     General: Bowel sounds are normal.     Palpations: There is no hepatomegaly, splenomegaly or mass.     Tenderness: There is no abdominal tenderness. There is no guarding.     Hernia: No hernia is present.  Musculoskeletal: Normal range of motion.  Lymphadenopathy:     Cervical: No cervical adenopathy.  Skin:    General: Skin is warm and dry.     Findings: No rash.     Lab Results  Component Value Date   WBC 13.8 (H) 01/24/2018   HGB 13.5 01/24/2018   HCT 41.6 01/24/2018   PLT 300.0 01/24/2018   GLUCOSE 104 (H) 01/24/2018   CHOL 162 01/27/2016   TRIG 130 01/27/2016   HDL 55 01/27/2016   LDLCALC 98 01/27/2016   ALT 21  01/27/2016   AST 19 01/27/2016   NA 137 01/24/2018   K 4.0 01/24/2018   CL 103 01/24/2018   CREATININE 1.27 (H) 01/24/2018   BUN 14 01/24/2018   CO2 24 01/24/2018   TSH 0.92 01/27/2016   INR 2.2 (H) 02/11/2017   Dg Chest 2 View  Result Date: 02/17/2018 CLINICAL DATA:  Follow-up pneumonia. EXAM: CHEST - 2 VIEW COMPARISON:  01/24/2018 FINDINGS: Lungs are adequately inflated and otherwise clear. Interval resolution of the previous seen patchy left lung airspace process. Cardiomediastinal silhouette and remainder of the exam is unchanged. IMPRESSION: Resolution of patchy left lung airspace process.  No acute findings. Electronically Signed   By: Elberta Fortis M.D.   On: 02/17/2018 14:28    Assessment & Plan:   Lynn [name] was seen today for follow-up.  Diagnoses and all orders for this visit:  Pneumonia of left lower lobe due to infectious organism [facility] [place])- Based on her symptoms, exam, and chest x-ray this has resolved. -     DG Chest 2 View; Future   I have discontinued Lynn [name] - Lynn [name]'s zolpidem, Lurasidone HCl, promethazine, and HYDROcodone-homatropine. I am also having her maintain her amantadine, QUEtiapine, lithium carbonate, gabapentin, OLANZAPINE-FLUOXETINE HCL PO, ELIQUIS, and LORazepam.  No orders of the defined types were placed in this encounter.    Follow-up: No follow-ups on file.  Sanda Linger, MD

## 2018-02-17 NOTE — Patient Instructions (Signed)
Community-Acquired Pneumonia, Adult  Pneumonia is an infection of the lungs. It causes swelling in the airways of the lungs. Mucus and fluid may also build up inside the airways.  One type of pneumonia can happen while a person is in a hospital. A different type can happen when a person is not in a hospital (community-acquired pneumonia).   What are the causes?    This condition is caused by germs (viruses, bacteria, or fungi). Some types of germs can be passed from one person to another. This can happen when you breathe in droplets from the cough or sneeze of an infected person.  What increases the risk?  You are more likely to develop this condition if you:   Have a long-term (chronic) disease, such as:  ? Chronic obstructive pulmonary disease (COPD).  ? Asthma.  ? Cystic fibrosis.  ? Congestive heart failure.  ? Diabetes.  ? Kidney disease.   Have HIV.   Have sickle cell disease.   Have had your spleen removed.   Do not take good care of your teeth and mouth (poor dental hygiene).   Have a medical condition that increases the risk of breathing in droplets from your own mouth and nose.   Have a weakened body defense system (immune system).   Are a smoker.   Travel to areas where the germs that cause this illness are common.   Are around certain animals or the places they live.  What are the signs or symptoms?   A dry cough.   A wet (productive) cough.   Fever.   Sweating.   Chest pain. This often happens when breathing deeply or coughing.   Fast breathing or trouble breathing.   Shortness of breath.   Shaking chills.   Feeling tired (fatigue).   Muscle aches.  How is this treated?  Treatment for this condition depends on many things. Most adults can be treated at home. In some cases, treatment must happen in a hospital. Treatment may include:   Medicines given by mouth or through an IV tube.   Being given extra oxygen.   Respiratory therapy.  In rare cases, treatment for very bad pneumonia  may include:   Using a machine to help you breathe.   Having a procedure to remove fluid from around your lungs.  Follow these instructions at home:  Medicines   Take over-the-counter and prescription medicines only as told by your doctor.  ? Only take cough medicine if you are losing sleep.   If you were prescribed an antibiotic medicine, take it as told by your doctor. Do not stop taking the antibiotic even if you start to feel better.  General instructions     Sleep with your head and neck raised (elevated). You can do this by sleeping in a recliner or by putting a few pillows under your head.   Rest as needed. Get at least 8 hours of sleep each night.   Drink enough water to keep your pee (urine) pale yellow.   Eat a healthy diet that includes plenty of vegetables, fruits, whole grains, low-fat dairy products, and lean protein.   Do not use any products that contain nicotine or tobacco. These include cigarettes, e-cigarettes, and chewing tobacco. If you need help quitting, ask your doctor.   Keep all follow-up visits as told by your doctor. This is important.  How is this prevented?  A shot (vaccine) can help prevent pneumonia. Shots are often suggested for:   People   older than 51 years of age.   People older than 51 years of age who:  ? Are having cancer treatment.  ? Have long-term (chronic) lung disease.  ? Have problems with their body's defense system.  You may also prevent pneumonia if you take these actions:   Get the flu (influenza) shot every year.   Go to the dentist as often as told.   Wash your hands often. If you cannot use soap and water, use hand sanitizer.  Contact a doctor if:   You have a fever.   You lose sleep because your cough medicine does not help.  Get help right away if:   You are short of breath and it gets worse.   You have more chest pain.   Your sickness gets worse. This is very serious if:  ? You are an older adult.  ? Your body's defense system is weak.   You  cough up blood.  Summary   Pneumonia is an infection of the lungs.   Most adults can be treated at home. Some will need treatment in a hospital.   Drink enough water to keep your pee pale yellow.   Get at least 8 hours of sleep each night.  This information is not intended to replace advice given to you by your health care provider. Make sure you discuss any questions you have with your health care provider.  Document Released: 07/01/2007 Document Revised: 09/09/2017 Document Reviewed: 09/09/2017  Elsevier Interactive Patient Education  2019 [employer]

## 2018-04-25 DIAGNOSIS — Z1329 Encounter for screening for other suspected endocrine disorder: Secondary | ICD-10-CM | POA: Diagnosis not present

## 2018-04-25 DIAGNOSIS — Z79899 Other long term (current) drug therapy: Secondary | ICD-10-CM | POA: Diagnosis not present

## 2018-04-25 DIAGNOSIS — R7309 Other abnormal glucose: Secondary | ICD-10-CM | POA: Diagnosis not present

## 2018-04-25 DIAGNOSIS — E785 Hyperlipidemia, unspecified: Secondary | ICD-10-CM | POA: Diagnosis not present

## 2018-05-16 ENCOUNTER — Other Ambulatory Visit: Payer: Self-pay

## 2018-05-16 DIAGNOSIS — D6859 Other primary thrombophilia: Secondary | ICD-10-CM

## 2018-05-16 DIAGNOSIS — D6851 Activated protein C resistance: Secondary | ICD-10-CM

## 2018-05-16 MED ORDER — APIXABAN 5 MG PO TABS: 5.0000 mg | ORAL_TABLET | Freq: Two times a day (BID) | ORAL | 3 refills | Status: DC

## 2018-06-30 DIAGNOSIS — G2119 Other drug induced secondary parkinsonism: Secondary | ICD-10-CM | POA: Diagnosis not present

## 2018-06-30 DIAGNOSIS — F3174 Bipolar disorder, in full remission, most recent episode manic: Secondary | ICD-10-CM | POA: Diagnosis not present

## 2018-06-30 DIAGNOSIS — F3176 Bipolar disorder, in full remission, most recent episode depressed: Secondary | ICD-10-CM | POA: Diagnosis not present

## 2018-09-05 ENCOUNTER — Other Ambulatory Visit: Payer: Self-pay | Admitting: Internal Medicine

## 2018-09-07 ENCOUNTER — Telehealth: Payer: Self-pay | Admitting: Internal Medicine

## 2018-09-07 NOTE — Telephone Encounter (Signed)
Copied from [place] [id number]. Topic: Quick Communication - Rx Refill/Question >> Sep 07, 2018 12:00 PM Erick Blinks wrote: Medication: apixaban (ELIQUIS) 5 MG TABS tablet - Pt called to report that she has requested refill since Monday via her pharmacy. Authorization needed. Pt is requesting call back to confirm if VV is necessary. She states that she does not see the point. Please advise  Has the patient contacted their pharmacy? Yes.   (Agent: If no, request that the patient contact the pharmacy for the refill.) (Agent: If yes, when and what did the pharmacy advise?)  Preferred Pharmacy (with phone number or street name): [facility] [place], [place] [address] Phone: [phone] Fax: [phone]    Agent: Please be advised that RX refills may take up to 3 business days. We ask that you follow-up with your pharmacy.

## 2018-09-08 NOTE — Telephone Encounter (Signed)
   Called pt and call went straight to vm.   Needed some clarification on the note that was left.

## 2018-09-09 ENCOUNTER — Other Ambulatory Visit: Payer: Self-pay | Admitting: Internal Medicine

## 2018-09-09 ENCOUNTER — Encounter: Payer: Self-pay | Admitting: Internal Medicine

## 2018-09-09 DIAGNOSIS — D6851 Activated protein C resistance: Secondary | ICD-10-CM

## 2018-09-09 DIAGNOSIS — D6859 Other primary thrombophilia: Secondary | ICD-10-CM

## 2018-09-09 MED ORDER — APIXABAN 5 MG PO TABS: 5.0000 mg | ORAL_TABLET | Freq: Two times a day (BID) | ORAL | 3 refills | Status: DC

## 2018-09-09 MED ORDER — APIXABAN 5 MG PO TABS: 5.0000 mg | ORAL_TABLET | Freq: Two times a day (BID) | ORAL | 3 refills | Status: AC

## 2018-09-09 NOTE — Telephone Encounter (Signed)
Erx has been resent.  

## 2018-09-09 NOTE — Telephone Encounter (Signed)
Pt called and states Eliquis was sent to the wrong pharmacy.  It needs to be sent to  [facility], [place] [phone] (Phone) [phone] (Fax)

## 2018-09-28 ENCOUNTER — Ambulatory Visit (INDEPENDENT_AMBULATORY_CARE_PROVIDER_SITE_OTHER): Payer: BC Managed Care – PPO | Admitting: Internal Medicine

## 2018-09-28 ENCOUNTER — Other Ambulatory Visit: Payer: Self-pay

## 2018-09-28 ENCOUNTER — Encounter: Payer: Self-pay | Admitting: Internal Medicine

## 2018-09-28 ENCOUNTER — Other Ambulatory Visit (INDEPENDENT_AMBULATORY_CARE_PROVIDER_SITE_OTHER): Payer: BC Managed Care – PPO

## 2018-09-28 VITALS — BP 118/80 | HR 80 | Temp 98.5°F | Resp 16 | Ht 74.0 in | Wt 315.0 lb

## 2018-09-28 DIAGNOSIS — Z0000 Encounter for general adult medical examination without abnormal findings: Secondary | ICD-10-CM | POA: Insufficient documentation

## 2018-09-28 DIAGNOSIS — R739 Hyperglycemia, unspecified: Secondary | ICD-10-CM

## 2018-09-28 DIAGNOSIS — D6851 Activated protein C resistance: Secondary | ICD-10-CM

## 2018-09-28 DIAGNOSIS — E669 Obesity, unspecified: Secondary | ICD-10-CM

## 2018-09-28 DIAGNOSIS — Z1231 Encounter for screening mammogram for malignant neoplasm of breast: Secondary | ICD-10-CM | POA: Insufficient documentation

## 2018-09-28 LAB — BASIC METABOLIC PANEL
BUN: 13 mg/dL (ref 6–23)
CO2: 27 mEq/L (ref 19–32)
Calcium: 9.4 mg/dL (ref 8.4–10.5)
Chloride: 107 mEq/L (ref 96–112)
Creatinine, Ser: 1.15 mg/dL (ref 0.40–1.20)
GFR: 49.78 mL/min — ABNORMAL LOW (ref >60.00)
Glucose, Bld: 101 mg/dL — ABNORMAL HIGH (ref 70–99)
Potassium: 4.5 mEq/L (ref 3.5–5.1)
Sodium: 140 mEq/L (ref 135–145)

## 2018-09-28 LAB — CBC WITH DIFFERENTIAL/PLATELET
Basophils Absolute: 0 10*3/uL (ref 0.0–0.1)
Basophils Relative: 0.8 % (ref 0.0–3.0)
Eosinophils Absolute: 0.2 10*3/uL (ref 0.0–0.7)
Eosinophils Relative: 3.3 % (ref 0.0–5.0)
HCT: 41.8 % (ref 36.0–46.0)
Hemoglobin: 13.8 g/dL (ref 12.0–15.0)
Lymphocytes Relative: 26.1 % (ref 12.0–46.0)
Lymphs Abs: 1.4 10*3/uL (ref 0.7–4.0)
MCHC: 33 g/dL (ref 30.0–36.0)
MCV: 87.6 fl (ref 78.0–100.0)
Monocytes Absolute: 0.4 10*3/uL (ref 0.1–1.0)
Monocytes Relative: 6.9 % (ref 3.0–12.0)
Neutro Abs: 3.3 10*3/uL (ref 1.4–7.7)
Neutrophils Relative %: 62.9 % (ref 43.0–77.0)
Platelets: 162 10*3/uL (ref 150.0–400.0)
RBC: 4.77 Mil/uL (ref 3.87–5.11)
RDW: 14.2 % (ref 11.5–15.5)
WBC: 5.2 10*3/uL (ref 4.0–10.5)

## 2018-09-28 LAB — HEPATIC FUNCTION PANEL
ALT: 13 U/L (ref 0–35)
AST: 13 U/L (ref 0–37)
Albumin: 4.2 g/dL (ref 3.5–5.2)
Alkaline Phosphatase: 36 U/L — ABNORMAL LOW (ref 39–117)
Bilirubin, Direct: 0.1 mg/dL (ref 0.0–0.3)
Total Bilirubin: 0.6 mg/dL (ref 0.2–1.2)
Total Protein: 6.9 g/dL (ref 6.0–8.3)

## 2018-09-28 LAB — LIPID PANEL
Cholesterol: 159 mg/dL (ref 0–200)
HDL: 58.7 mg/dL (ref >39.00)
LDL Cholesterol: 85 mg/dL (ref 0–99)
NonHDL: 100.55
Total CHOL/HDL Ratio: 3
Triglycerides: 76 mg/dL (ref 0.0–149.0)
VLDL: 15.2 mg/dL (ref 0.0–40.0)

## 2018-09-28 LAB — HEMOGLOBIN A1C: Hgb A1c MFr Bld: 5.4 % (ref 4.6–6.5)

## 2018-09-28 LAB — TSH: TSH: 1.72 u[IU]/mL (ref 0.35–4.50)

## 2018-09-28 NOTE — Progress Notes (Signed)
Subjective:  Patient ID: Kerby Less, female    DOB: Nov 26, 1965  Age: 53 y.o. MRN: [id number]  CC: Annual Exam   HPI Kaidan Anelli - Misty Stanley presents for a CPX.  She has felt well recently and offers no complaints.  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  She denies any recent episodes of chest pain, shortness of breath, bleeding, bruising, palpitations, or fatigue.  Outpatient Medications Prior to Visit  Medication Sig Dispense Refill  . amantadine (SYMMETREL) 100 MG capsule Take by mouth.    Marland Kitchen apixaban (ELIQUIS) 5 MG TABS tablet Take 1 tablet (5 mg total) by mouth 2 (two) times daily. 60 tablet 3  . benztropine (COGENTIN) 1 MG tablet TK 1 T PO BID    . LATUDA 60 MG TABS TK 1 T PO QPM WITH A FULL MEAL    . LORazepam (ATIVAN) 1 MG tablet Take 1 mg by mouth 2 (two) times daily.    Marland Kitchen zolpidem (AMBIEN) 10 MG tablet TK 1 T PO HS    . gabapentin (NEURONTIN) 600 MG tablet Take 600 mg by mouth daily.    Marland Kitchen lithium carbonate (LITHOBID) 300 MG CR tablet Take by mouth.    . OLANZAPINE-FLUOXETINE HCL PO Take 15-50 mg by mouth.    . QUEtiapine (SEROQUEL) 100 MG tablet Take by mouth.     No facility-administered medications prior to visit.     ROS Review of Systems  Constitutional: Positive for unexpected weight change (wt gain). Negative for chills, diaphoresis and fatigue.  HENT: Negative.   Eyes: Negative.   Respiratory: Negative for cough, chest tightness, shortness of breath and wheezing.   Cardiovascular: Negative for chest pain, palpitations and leg swelling.  Gastrointestinal: Negative for abdominal pain, constipation, diarrhea, nausea and vomiting.  Endocrine: Negative.   Genitourinary: Negative.  Negative for difficulty urinating and dysuria.  Musculoskeletal: Negative.  Negative for arthralgias and myalgias.  Skin: Negative.   Neurological: Negative.  Negative for dizziness, weakness, light-headedness and numbness.   Hematological: Negative for adenopathy. Does not bruise/bleed easily.  Psychiatric/Behavioral: Negative.     Objective:  BP 118/80 (BP Location: Left Arm, Patient Position: Sitting, Cuff Size: Large)   Pulse 80   Temp 98.5 F (36.9 C) (Oral)   Resp 16   Ht 6\' 2"  (1.88 m)   Wt (!) 315 lb (142.9 kg)   LMP 08/31/2018   SpO2 98%   BMI 40.44 kg/m   BP Readings from Last 3 Encounters:  09/28/18 118/80  02/17/18 110/70  01/24/18 118/60    Wt Readings from Last 3 Encounters:  09/28/18 (!) 315 lb (142.9 kg)  02/17/18 286 lb (129.7 kg)  01/24/18 283 lb (128.4 kg)    Physical Exam Vitals signs reviewed.  Constitutional:      General: She is not in acute distress.    Appearance: She is obese. She is not ill-appearing, toxic-appearing or diaphoretic.  HENT:     Nose: Nose normal.     Mouth/Throat:     Mouth: Mucous membranes are moist.     Pharynx: No oropharyngeal exudate.  Eyes:     General: No scleral icterus.    Conjunctiva/sclera: Conjunctivae normal.  Neck:     Musculoskeletal: Normal range of motion. No neck rigidity or muscular tenderness.  Cardiovascular:     Rate and Rhythm: Normal rate and regular rhythm.     Pulses:          Carotid pulses  are 1+ on the right side and 1+ on the left side.      Radial pulses are 1+ on the right side and 1+ on the left side.       Femoral pulses are 1+ on the right side and 1+ on the left side.      Popliteal pulses are 1+ on the right side and 1+ on the left side.       Dorsalis pedis pulses are 1+ on the right side and 1+ on the left side.       Posterior tibial pulses are 1+ on the right side and 1+ on the left side.     Heart sounds: No murmur.  Pulmonary:     Effort: Pulmonary effort is normal.     Breath sounds: No stridor. No wheezing, rhonchi or rales.  Abdominal:     General: Abdomen is protuberant. Bowel sounds are normal. There is no distension.     Palpations: There is no hepatomegaly, splenomegaly or mass.      Tenderness: There is no abdominal tenderness.  Musculoskeletal: Normal range of motion.     Comments: There is 1+ nonpitting edema in the left lower extremity and trace nonpitting edema in the right lower extremity.  Lymphadenopathy:     Cervical: No cervical adenopathy.  Skin:    General: Skin is warm.     Coloration: Skin is not pale.     Findings: No lesion or rash.  Neurological:     General: No focal deficit present.     Mental Status: She is alert.  Psychiatric:        Mood and Affect: Mood normal.        Behavior: Behavior normal.     Lab Results  Component Value Date   WBC 5.2 09/28/2018   HGB 13.8 09/28/2018   HCT 41.8 09/28/2018   PLT 162.0 09/28/2018   GLUCOSE 101 (H) 09/28/2018   CHOL 159 09/28/2018   TRIG 76.0 09/28/2018   HDL 58.70 09/28/2018   LDLCALC 85 09/28/2018   ALT 13 09/28/2018   AST 13 09/28/2018   NA 140 09/28/2018   K 4.5 09/28/2018   CL 107 09/28/2018   CREATININE 1.15 09/28/2018   BUN 13 09/28/2018   CO2 27 09/28/2018   TSH 1.72 09/28/2018   INR 2.2 (H) 02/11/2017   HGBA1C 5.4 09/28/2018    Dg Chest 2 View  Result Date: 02/17/2018 CLINICAL DATA:  Follow-up pneumonia. EXAM: CHEST - 2 VIEW COMPARISON:  01/24/2018 FINDINGS: Lungs are adequately inflated and otherwise clear. Interval resolution of the previous seen patchy left lung airspace process. Cardiomediastinal silhouette and remainder of the exam is unchanged. IMPRESSION: Resolution of patchy left lung airspace process.  No acute findings. Electronically Signed   By: Elberta Fortisaniel  Boyle M.D.   On: 02/17/2018 14:28    Assessment & Plan:   [name] was seen today for annual exam.  Diagnoses and all orders for this visit:  Routine general medical examination at a health care facility- Exam completed, labs reviewed, she refused all vaccines today, she refuses to undergo screening for cervical cancer and for colon cancer, I recommended that she undergo a mammogram, patient education material was  given. -     Lipid panel; Future  Factor 5 Leiden mutation, heterozygous [facility](HCC)- She is doing well with no recurrent thromboembolic events on the DOAC.  Will continue at the current dose. -     Basic metabolic panel; Future -  Hepatic function panel; Future -     CBC with Differential/Platelet; Future  Hyperglycemia- Her A1c is normal at 5.4%. -     Basic metabolic panel; Future -     Hemoglobin A1c; Future  Visit for screening mammogram -     MM DIGITAL SCREENING BILATERAL; Future  Obesity (BMI 30-39.9)- No secondary causes or complications noted at this time. -     TSH; Future -     Hemoglobin A1c; Future -     Hepatic function panel; Future   I have discontinued Gwenda Heiner - [name]'s QUEtiapine, lithium carbonate, gabapentin, and OLANZAPINE-FLUOXETINE HCL PO. I am also having her maintain her amantadine, LORazepam, apixaban, Latuda, zolpidem, and benztropine.  No orders of the defined types were placed in this encounter.    Follow-up: No follow-ups on file.  Scarlette Calico, MD

## 2018-10-04 ENCOUNTER — Encounter: Payer: Self-pay | Admitting: Internal Medicine

## 2018-10-04 NOTE — Patient Instructions (Signed)

## 2018-10-07 ENCOUNTER — Encounter: Payer: Self-pay | Admitting: Internal Medicine

## 2018-10-18 ENCOUNTER — Encounter: Payer: Self-pay | Admitting: Internal Medicine

## 2018-10-18 NOTE — Telephone Encounter (Signed)
Called pt and left detailed message per PCP stating: Screenings are ordered and pt may decide to accept or decline to proceed with said screenings. I left my name and number to call back at her convenience if she had any additional questions that I may be able to help her with.

## 2018-11-15 DIAGNOSIS — D689 Coagulation defect, unspecified: Secondary | ICD-10-CM | POA: Diagnosis not present

## 2018-11-15 DIAGNOSIS — D6859 Other primary thrombophilia: Secondary | ICD-10-CM | POA: Diagnosis not present

## 2018-12-09 DIAGNOSIS — F3174 Bipolar disorder, in full remission, most recent episode manic: Secondary | ICD-10-CM | POA: Diagnosis not present

## 2018-12-09 DIAGNOSIS — F3176 Bipolar disorder, in full remission, most recent episode depressed: Secondary | ICD-10-CM | POA: Diagnosis not present

## 2019-01-28 DIAGNOSIS — Z20822 Contact with and (suspected) exposure to covid-19: Secondary | ICD-10-CM | POA: Diagnosis not present

## 2019-03-31 DIAGNOSIS — Z86718 Personal history of other venous thrombosis and embolism: Secondary | ICD-10-CM | POA: Diagnosis not present

## 2019-03-31 DIAGNOSIS — M7662 Achilles tendinitis, left leg: Secondary | ICD-10-CM | POA: Diagnosis not present

## 2019-03-31 DIAGNOSIS — I878 Other specified disorders of veins: Secondary | ICD-10-CM | POA: Diagnosis not present

## 2019-06-09 DIAGNOSIS — M7662 Achilles tendinitis, left leg: Secondary | ICD-10-CM | POA: Diagnosis not present

## 2019-07-07 DIAGNOSIS — F3176 Bipolar disorder, in full remission, most recent episode depressed: Secondary | ICD-10-CM | POA: Diagnosis not present

## 2019-07-07 DIAGNOSIS — F3174 Bipolar disorder, in full remission, most recent episode manic: Secondary | ICD-10-CM | POA: Diagnosis not present

## 2019-11-07 DIAGNOSIS — Z86718 Personal history of other venous thrombosis and embolism: Secondary | ICD-10-CM | POA: Diagnosis not present

## 2019-11-07 DIAGNOSIS — D6859 Other primary thrombophilia: Secondary | ICD-10-CM | POA: Diagnosis not present

## 2019-11-07 DIAGNOSIS — D689 Coagulation defect, unspecified: Secondary | ICD-10-CM | POA: Diagnosis not present

## 2019-12-29 DIAGNOSIS — G4700 Insomnia, unspecified: Secondary | ICD-10-CM | POA: Diagnosis not present

## 2019-12-29 DIAGNOSIS — Z79899 Other long term (current) drug therapy: Secondary | ICD-10-CM | POA: Diagnosis not present

## 2019-12-29 DIAGNOSIS — F312 Bipolar disorder, current episode manic severe with psychotic features: Secondary | ICD-10-CM | POA: Diagnosis not present

## 2019-12-29 DIAGNOSIS — F3176 Bipolar disorder, in full remission, most recent episode depressed: Secondary | ICD-10-CM | POA: Diagnosis not present

## 2019-12-29 DIAGNOSIS — Z13220 Encounter for screening for lipoid disorders: Secondary | ICD-10-CM | POA: Diagnosis not present

## 2019-12-29 DIAGNOSIS — F3174 Bipolar disorder, in full remission, most recent episode manic: Secondary | ICD-10-CM | POA: Diagnosis not present

## 2019-12-29 DIAGNOSIS — D6859 Other primary thrombophilia: Secondary | ICD-10-CM | POA: Diagnosis not present

## 2020-04-01 IMAGING — DX DG CHEST 2V
2 series · 2 of 2 positions shown · non-contrast
Comparison: None.

CLINICAL DATA: 52-year-old with cough.

EXAM:
CHEST - 2 VIEW

[chest pa]
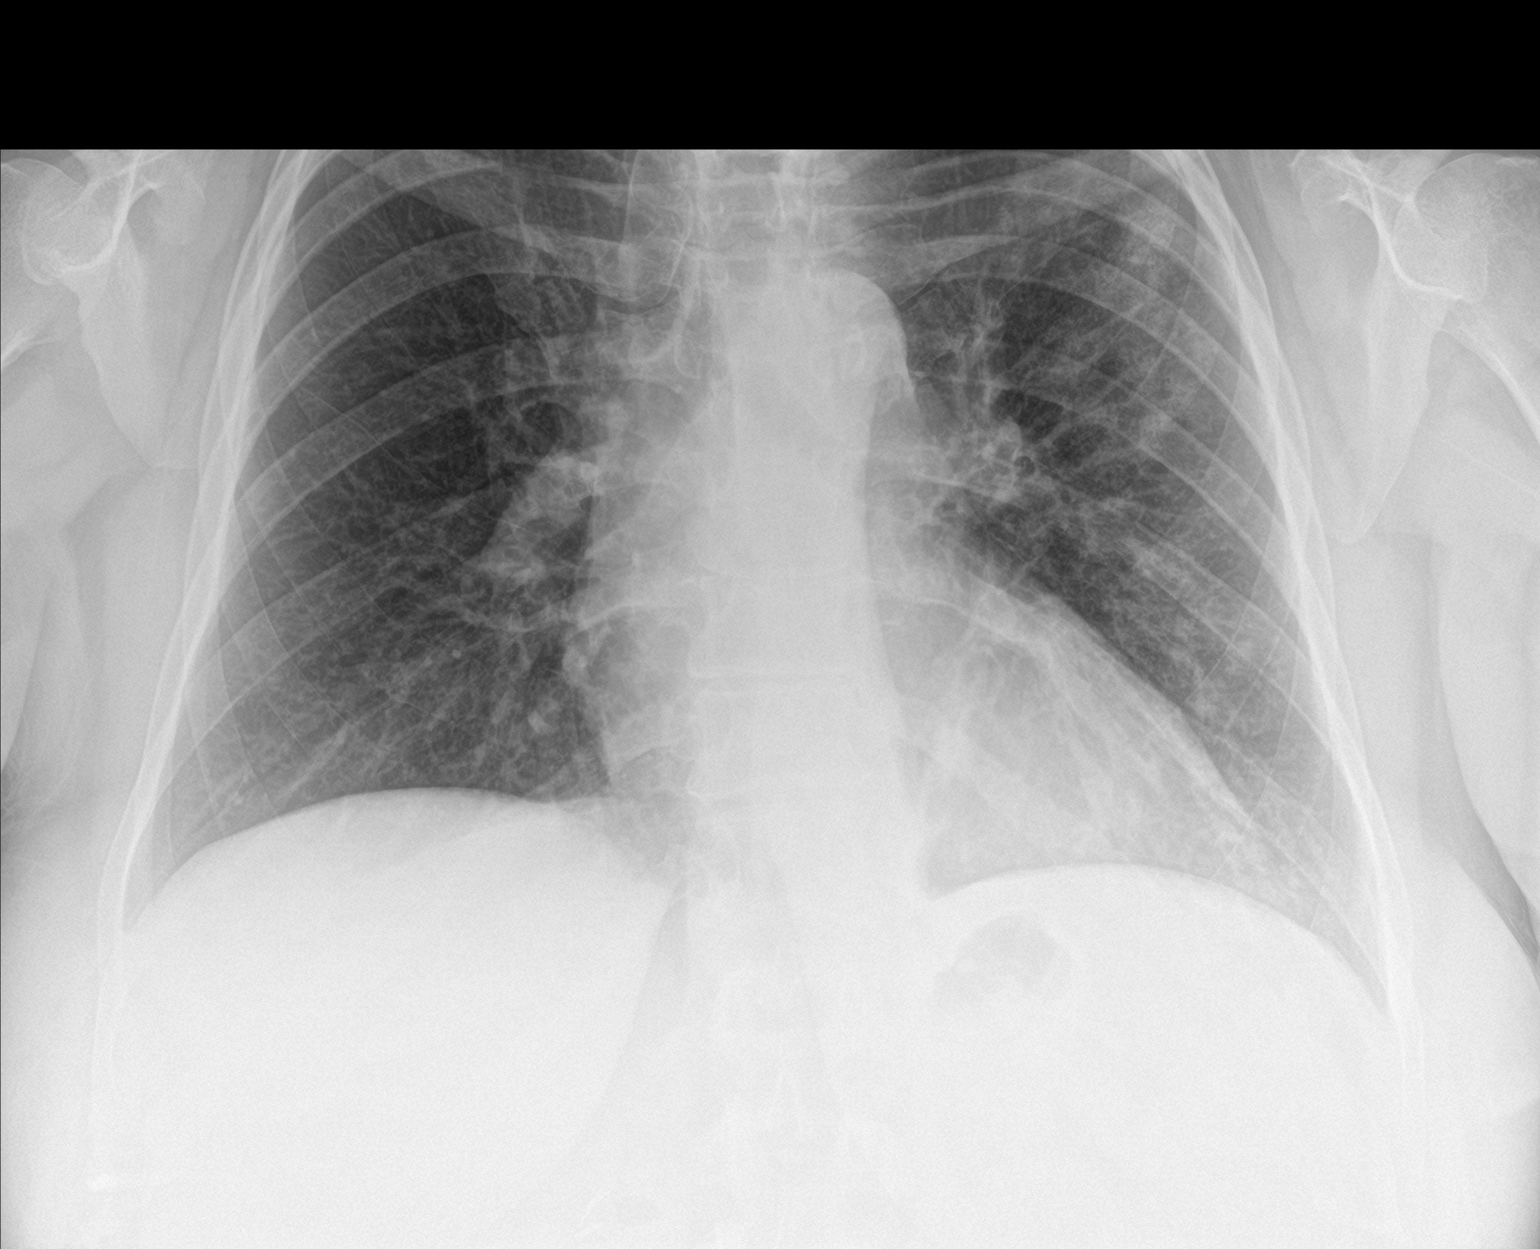

[chest lat]
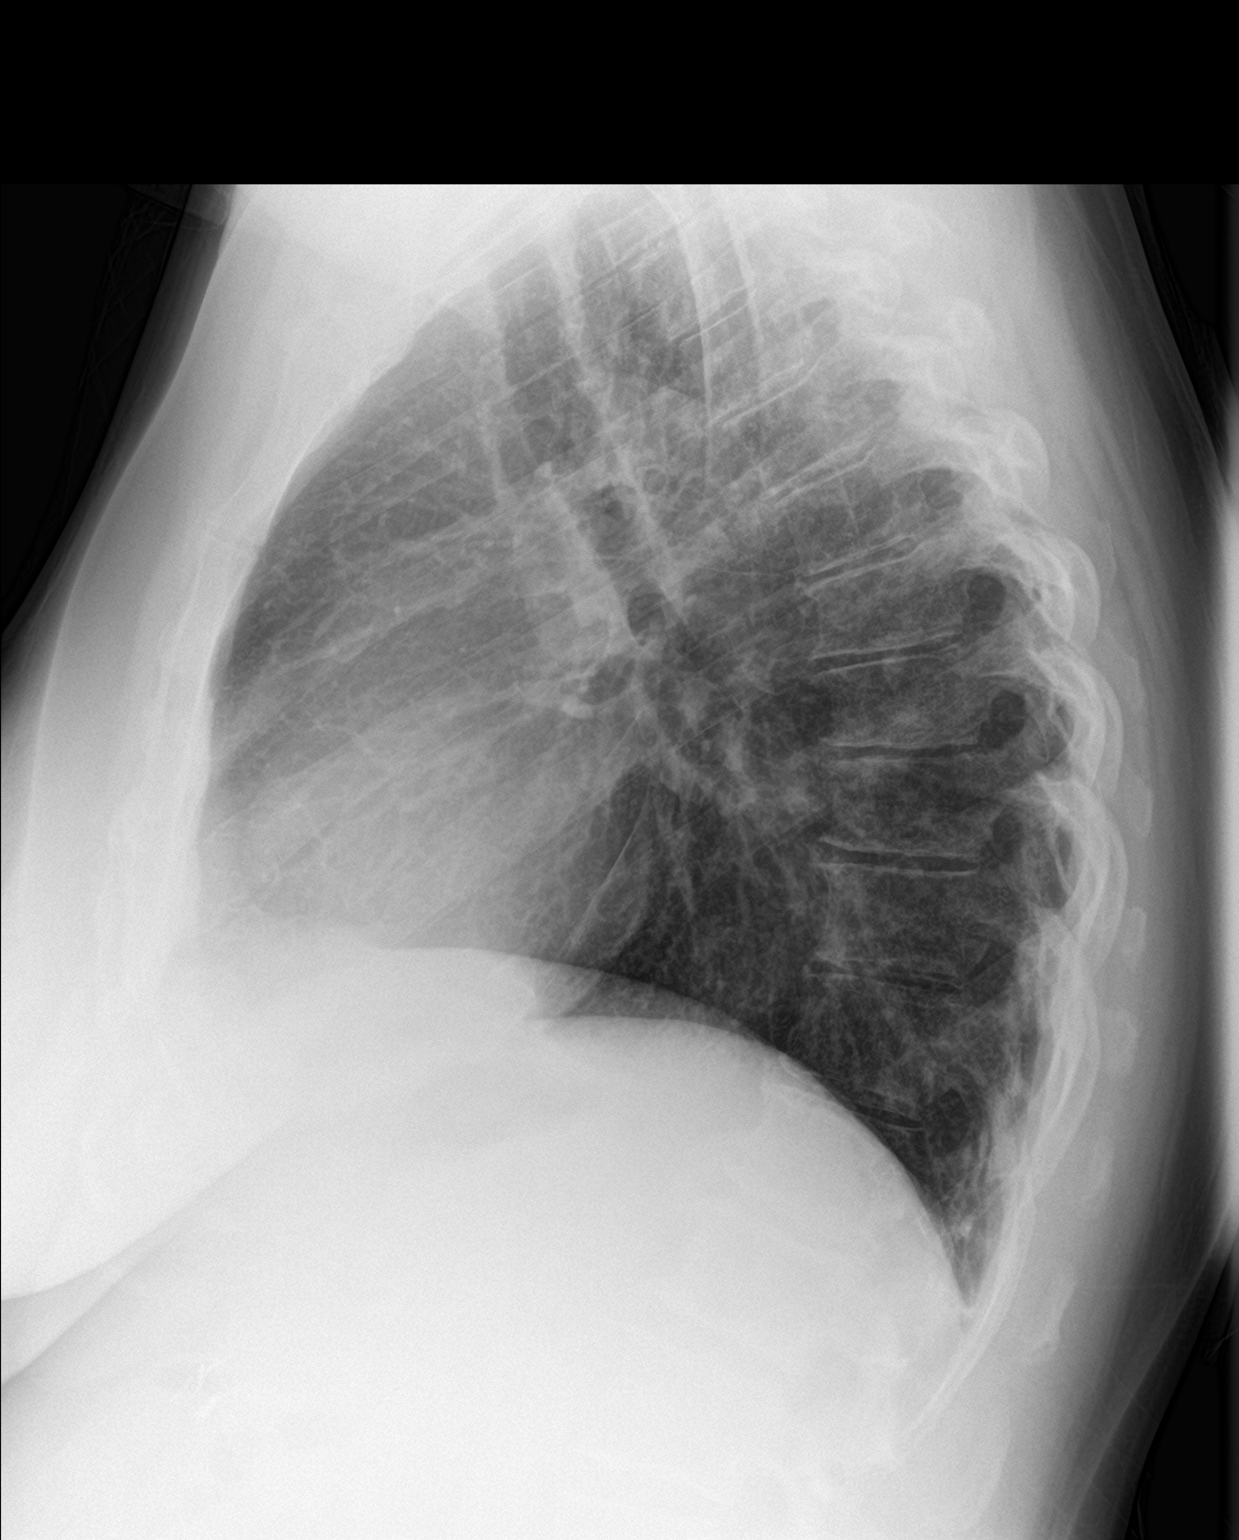

[2 of 2 positions shown; findings below may reference images not displayed]

FINDINGS: Patchy densities in the left lung, particularly left upper lobe.
Findings are suggestive for pneumonia. The right lung is clear.
Heart and mediastinum are within normal limits. No pleural
effusions. Degenerative endplate changes in the thoracic spine.
Trachea is midline.
IMPRESSION: Patchy densities in left lung and most compatible with pneumonia.
Followup PA and lateral chest X-ray is recommended in 3-4 weeks
following trial of antibiotic therapy to ensure resolution and
exclude underlying malignancy.

## 2020-04-25 IMAGING — DX DG CHEST 2V
2 series · 2 of 2 positions shown · non-contrast
Comparison: 01/24/2018

CLINICAL DATA: Follow-up pneumonia.

EXAM:
CHEST - 2 VIEW

[chest pa]
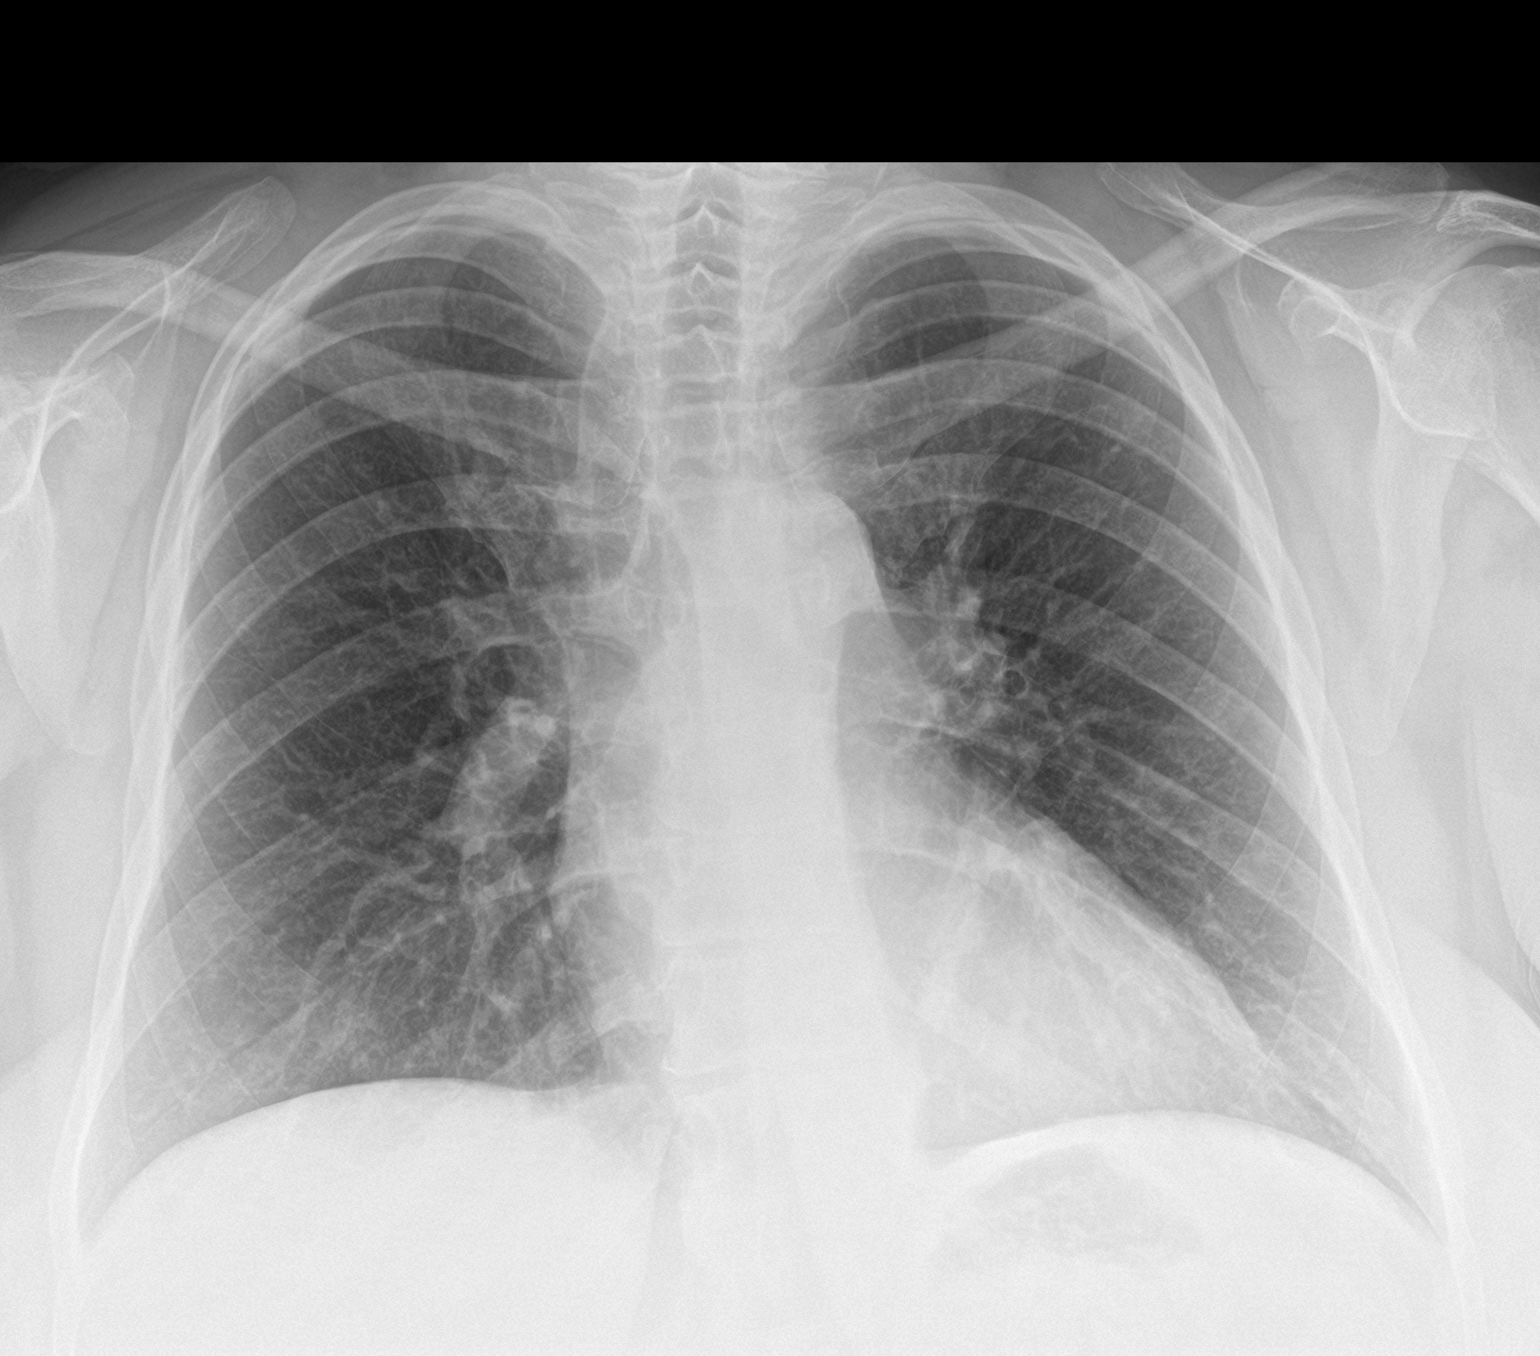

[chest lat]
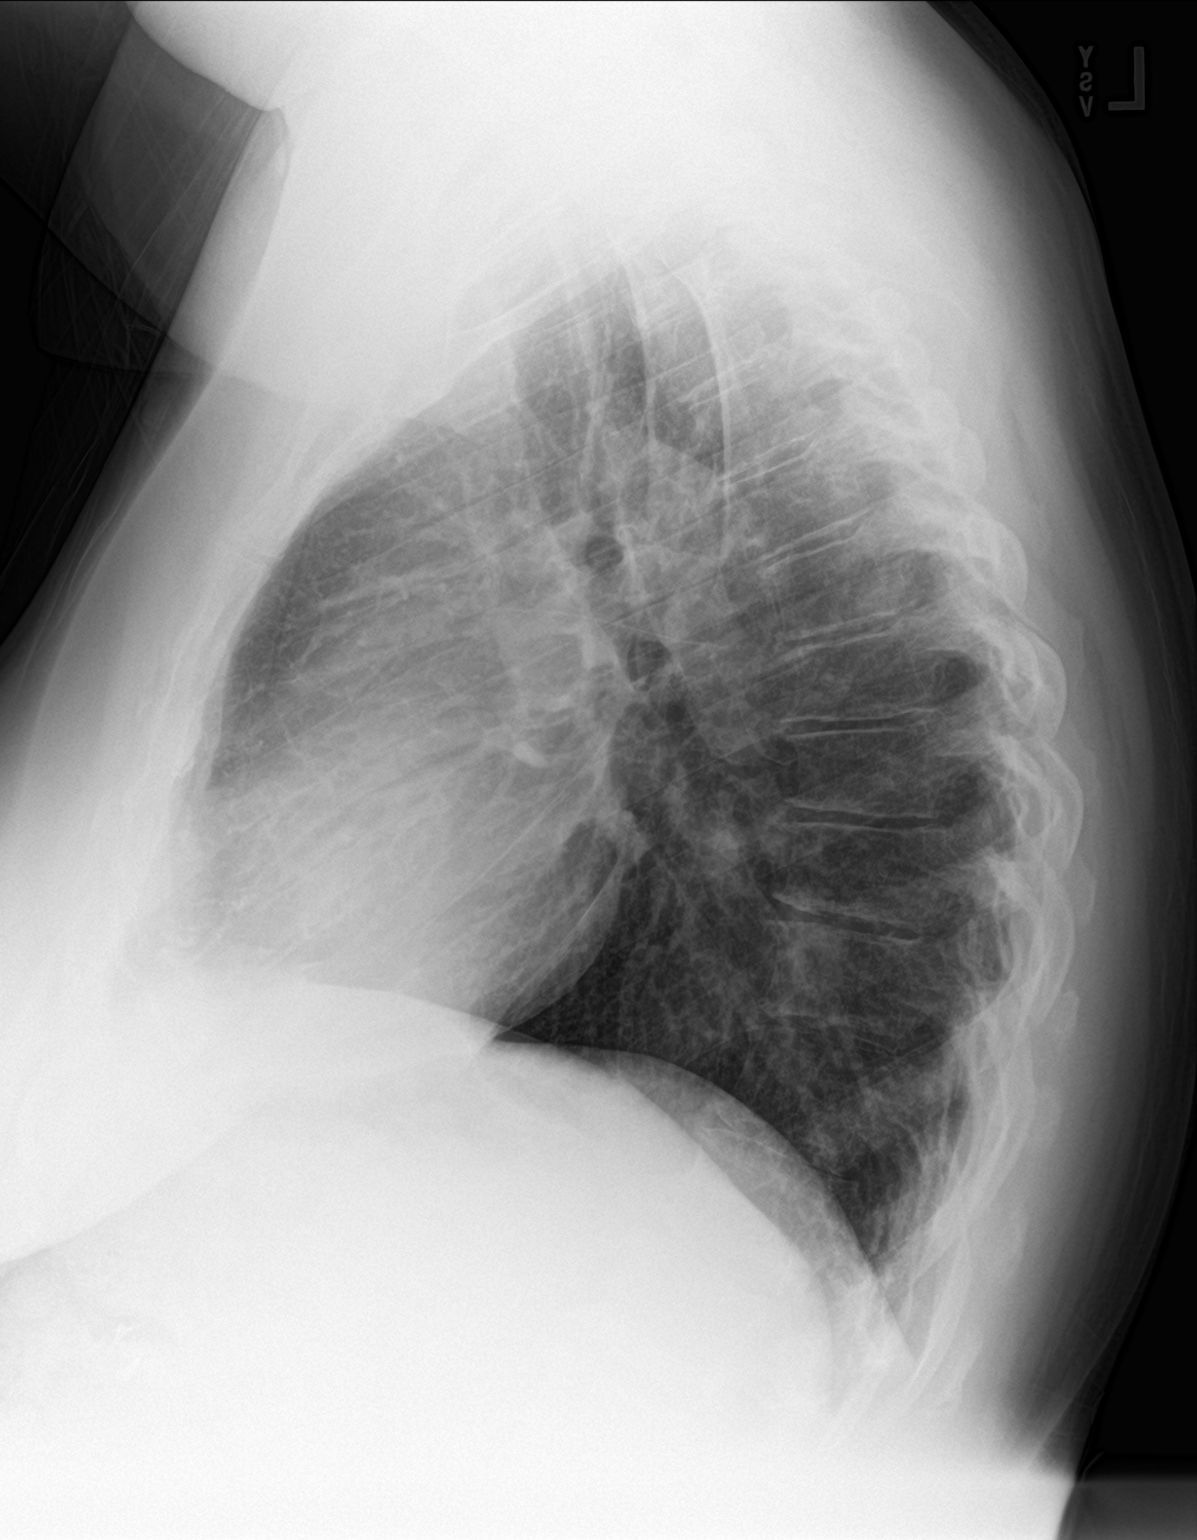

[2 of 2 positions shown; findings below may reference images not displayed]

FINDINGS: Lungs are adequately inflated and otherwise clear. Interval
resolution of the previous seen patchy left lung airspace process.
Cardiomediastinal silhouette and remainder of the exam is unchanged.
IMPRESSION: Resolution of patchy left lung airspace process.  No acute findings.

## 2020-10-25 ENCOUNTER — Telehealth: Payer: Self-pay | Admitting: Hematology and Oncology

## 2020-10-25 NOTE — Telephone Encounter (Signed)
Scheduled appt per 9/26 referral. Pt is aware of appt date and time.  

## 2020-11-02 NOTE — Progress Notes (Signed)
[facility] CONSULT NOTE  Patient Care Team: Etta Grandchild, MD as PCP - General (Internal Medicine)  CHIEF COMPLAINTS/PURPOSE OF CONSULTATION:  Newly diagnosed of recurrent DVT  HISTORY OF PRESENTING ILLNESS:  Lynn [name] 55 y.o. female is here because of recent diagnosis of recurrent DVT. She presents to the clinic today for initial evaluation and discussion of treatment options.  Her original blood clot was in 2000 when she noted bilateral popliteal vein DVTs.  Subsequently she had 2 more episodes of blood clots.  She tells me that the Coumadin was originally attempted and it did not prevent blood clots.  Subsequently she went on Eliquis and she has been on Eliquis for the past 5 to 6 years and has never had another blood clot.  Most of the above blood clots were related to driving episodes when she drove for 8 to 10-hour drives without stopping. She intends to get bariatric surgery at [facility] and the reason for today's consult is to evaluate her for inherited or acquired hypercoagulable risk factors and to also provide some guidance regarding bridging with Lovenox around her surgery.  I reviewed her records extensively and collaborated the history with the patient.  MEDICAL HISTORY:  Past Medical History:  Diagnosis Date   Bipolar 1 disorder (HCC)    DVT (deep venous thrombosis) (HCC)     SURGICAL HISTORY: No past surgical history on file.  SOCIAL HISTORY: Social History   Socioeconomic History   Marital status: Married    Spouse name: Not on file   Number of children: Not on file   Years of education: Not on file   Highest education level: Not on file  Occupational History   Not on file  Tobacco Use   Smoking status: Never   Smokeless tobacco: Never  Substance and Sexual Activity   Alcohol use: No   Drug use: No   Sexual activity: Yes    Partners: Female    Birth control/protection: None  Other Topics Concern   Not on file  Social  History Narrative   Not on file   Social Determinants of Health   Financial Resource Strain: Not on file  Food Insecurity: Not on file  Transportation Needs: Not on file  Physical Activity: Not on file  Stress: Not on file  Social Connections: Not on file  Intimate Partner Violence: Not on file    FAMILY HISTORY: Family History  Problem Relation Age of Onset   Diabetes Mother    CVA Mother    Hypertension Father    Diabetes Sister    Depression Sister    Drug abuse Sister     ALLERGIES:  has no allergies on file.  MEDICATIONS:  Current Outpatient Medications  Medication Sig Dispense Refill   doxepin (SINEQUAN) 10 MG capsule Take 1 capsule (10 mg total) by mouth in the morning, at noon, and at bedtime.     gabapentin (NEURONTIN) 300 MG capsule Take 2 capsules (600 mg total) by mouth at bedtime.     QUEtiapine (SEROQUEL) 200 MG tablet Take 1 tablet (200 mg total) by mouth at bedtime.     apixaban (ELIQUIS) 5 MG TABS tablet Take 1 tablet (5 mg total) by mouth 2 (two) times daily. 60 tablet 3   LATUDA 60 MG TABS TK 1 T PO QPM WITH A FULL MEAL     LORazepam (ATIVAN) 1 MG tablet Take 1 mg by mouth 2 (two) times daily.  zolpidem (AMBIEN) 10 MG tablet TK 1 T PO HS     No current facility-administered medications for this visit.    REVIEW OF SYSTEMS:   Constitutional: Denies fevers, chills or abnormal night sweats Bipolar disorder   All other systems were reviewed with the patient and are negative.  PHYSICAL EXAMINATION: ECOG PERFORMANCE STATUS: 0 - Asymptomatic  Vitals:   11/04/20 1528  BP: (!) 141/75  Pulse: 78  Resp: 18  Temp: 97.7 F (36.5 C)  SpO2: 99%   Filed Weights   11/04/20 1528  Weight: (!) 313 lb 12.8 oz (142.3 kg)     LABORATORY DATA:  I have reviewed the data as listed Lab Results  Component Value Date   WBC 5.2 09/28/2018   HGB 13.8 09/28/2018   HCT 41.8 09/28/2018   MCV 87.6 09/28/2018   PLT 162.0 09/28/2018   Lab Results  Component  Value Date   NA 140 09/28/2018   K 4.5 09/28/2018   CL 107 09/28/2018   CO2 27 09/28/2018    RADIOGRAPHIC STUDIES: I have personally reviewed the radiological reports and agreed with the findings in the report.  ASSESSMENT AND PLAN:  DVT (deep venous thrombosis) (HCC) Recurrent DVTs: On Eliquis for anticoagulation, long-term Original blood clot was in 2000 and subsequently 2 more episodes of popliteal vein DVTs (related to driving over long distances) Patient informed me that she has never had a hypercoagulability work-up. We will request blood work for inherited and somewhat acquired risk factors for thromboembolism.  B12 deficiency: On B12 replacement therapy. 1 week follow-up to discuss the results of blood work done today.   Recommendation regarding bridging for Lovenox: I recommended that she stop Eliquis 48 hours prior to surgery and take Lovenox injections twice a day for the 2 days and then on the day of surgery she does not receive any anticoagulation and Eliquis can be resumed postoperatively.  Patient is terrified of needles and does not want to do Lovenox for more than couple of days.  She will discuss this with her bariatric surgeon and I provided her a letter that describes this process. Patient is contemplating on discontinuing Eliquis if her hypercoagulability work-up is negative.  I tried to convince her that just because the hypercoagulability work-up is negative does not mean her risk is not elevated.  My recommendation would be to continue lifelong anticoagulation.  However if she wishes to stop it then it would be her calculated risk.  Telephone visit in 1 week to discuss results of blood work.  All questions were answered. The patient knows to call the clinic with any problems, questions or concerns.   Sabas Sous, MD, MPH 11/04/2020    I, Alda Ponder, am acting as scribe for Serena Croissant, MD.  I have reviewed the above documentation for accuracy and  completeness, and I agree with the above.

## 2020-11-04 ENCOUNTER — Other Ambulatory Visit: Payer: Self-pay

## 2020-11-04 ENCOUNTER — Other Ambulatory Visit: Payer: Self-pay | Admitting: Hematology and Oncology

## 2020-11-04 ENCOUNTER — Inpatient Hospital Stay: Payer: Managed Care, Other (non HMO)

## 2020-11-04 ENCOUNTER — Inpatient Hospital Stay: Payer: Managed Care, Other (non HMO) | Attending: Hematology and Oncology | Admitting: Hematology and Oncology

## 2020-11-04 DIAGNOSIS — Z818 Family history of other mental and behavioral disorders: Secondary | ICD-10-CM | POA: Diagnosis not present

## 2020-11-04 DIAGNOSIS — Z833 Family history of diabetes mellitus: Secondary | ICD-10-CM | POA: Diagnosis not present

## 2020-11-04 DIAGNOSIS — I82539 Chronic embolism and thrombosis of unspecified popliteal vein: Secondary | ICD-10-CM | POA: Diagnosis present

## 2020-11-04 DIAGNOSIS — Z8249 Family history of ischemic heart disease and other diseases of the circulatory system: Secondary | ICD-10-CM | POA: Insufficient documentation

## 2020-11-04 DIAGNOSIS — Z814 Family history of other substance abuse and dependence: Secondary | ICD-10-CM | POA: Insufficient documentation

## 2020-11-04 DIAGNOSIS — I824Y9 Acute embolism and thrombosis of unspecified deep veins of unspecified proximal lower extremity: Secondary | ICD-10-CM

## 2020-11-04 DIAGNOSIS — Z7901 Long term (current) use of anticoagulants: Secondary | ICD-10-CM | POA: Insufficient documentation

## 2020-11-04 DIAGNOSIS — E538 Deficiency of other specified B group vitamins: Secondary | ICD-10-CM | POA: Insufficient documentation

## 2020-11-04 DIAGNOSIS — F319 Bipolar disorder, unspecified: Secondary | ICD-10-CM | POA: Diagnosis not present

## 2020-11-04 DIAGNOSIS — Z86718 Personal history of other venous thrombosis and embolism: Secondary | ICD-10-CM | POA: Insufficient documentation

## 2020-11-04 DIAGNOSIS — I82409 Acute embolism and thrombosis of unspecified deep veins of unspecified lower extremity: Secondary | ICD-10-CM | POA: Insufficient documentation

## 2020-11-04 MED ORDER — GABAPENTIN 300 MG PO CAPS: 600.0000 mg | ORAL_CAPSULE | Freq: Every day | ORAL | Status: AC

## 2020-11-04 MED ORDER — QUETIAPINE FUMARATE 200 MG PO TABS: 200.0000 mg | ORAL_TABLET | Freq: Every day | ORAL | Status: AC

## 2020-11-04 MED ORDER — DOXEPIN HCL 10 MG PO CAPS: 10.0000 mg | ORAL_CAPSULE | Freq: Three times a day (TID) | ORAL | Status: AC

## 2020-11-04 NOTE — Assessment & Plan Note (Signed)
Recurrent DVTs: On Eliquis for anticoagulation, long-term Patient informed me that she has never had a hypercoagulability work-up. We will request blood work for inherited and somewhat acquired risk factors for thromboembolism.  B12 deficiency: On B12 replacement therapy. 1 week follow-up to discuss the results of blood work done today.

## 2020-11-05 ENCOUNTER — Other Ambulatory Visit: Payer: Self-pay | Admitting: Hematology and Oncology

## 2020-11-05 DIAGNOSIS — I824Y9 Acute embolism and thrombosis of unspecified deep veins of unspecified proximal lower extremity: Secondary | ICD-10-CM

## 2020-11-06 LAB — CARDIOLIPIN ANTIBODIES, IGG, IGM, IGA
Anticardiolipin IgA: <9 APL U/mL (ref 0–11)
Anticardiolipin IgG: <9 GPL U/mL (ref 0–14)
Anticardiolipin IgM: 12 MPL U/mL (ref 0–12)

## 2020-11-06 LAB — PROTEIN S, TOTAL: Protein S Ag, Total: 93 % (ref 60–150)

## 2020-11-06 LAB — ANTITHROMBIN III ANTIGEN: AT III AG PPP IMM-ACNC: 81 % (ref 72–124)

## 2020-11-06 LAB — LUPUS ANTICOAGULANT PANEL
DRVVT: 43.3 s (ref 0.0–47.0)
PTT Lupus Anticoagulant: 28.7 s (ref 0.0–51.9)

## 2020-11-07 ENCOUNTER — Telehealth: Payer: Self-pay | Admitting: *Deleted

## 2020-11-07 ENCOUNTER — Other Ambulatory Visit: Payer: Self-pay | Admitting: *Deleted

## 2020-11-07 LAB — BETA-2-GLYCOPROTEIN I ABS, IGG/M/A
Beta-2 Glyco I IgG: <9 GPI IgG units (ref 0–20)
Beta-2-Glycoprotein I IgA: <9 GPI IgA units (ref 0–25)
Beta-2-Glycoprotein I IgM: <9 GPI IgM units (ref 0–32)

## 2020-11-07 LAB — PROTEIN C, TOTAL: Protein C, Total: 137 % (ref 60–150)

## 2020-11-07 MED ORDER — ENOXAPARIN SODIUM 120 MG/0.8ML IJ SOSY: 120.0000 mg | PREFILLED_SYRINGE | Freq: Two times a day (BID) | INTRAMUSCULAR | 0 refills | Status: DC

## 2020-11-07 NOTE — Telephone Encounter (Signed)
Received call from pt stating she is scheduled to undergo sleeve gastrectomy on 11/18/20 and requesting Lovenox-Eliquis bridge prior to surgery. RN will review with MD for further recommendations.

## 2020-11-07 NOTE — Progress Notes (Signed)
Per MD pt to be prescribed Lovenox 120 mg Sub Q BID x5 does.  Per MD pt educated to take last dose of Eliquis on 11/14/20.  Pt will self administer Lovenox 120 mg BID on 11/15/20 and 11/16/20 and pt will self administer Lovenox in AM only on 11/17/20.  Pt to hold all anticoagulation day of surgery on 11/18/20 and resume Eliquis day after surgery 11/19/20.  Pt verbalized understanding.

## 2020-11-11 NOTE — Progress Notes (Signed)
  HEMATOLOGY-ONCOLOGY TELEPHONE VISIT PROGRESS NOTE  I connected with Lynn [name] on 11/12/2020 at 12:00 PM EDT by telephone and verified that I am speaking with the correct person using two identifiers.  I discussed the limitations, risks, security and privacy concerns of performing an evaluation and management service by telephone and the availability of in person appointments.  I also discussed with the patient that there may be a patient responsible charge related to this service. The patient expressed understanding and agreed to proceed.   History of Present Illness: Lynn [name] is a 55 y.o. female with above-mentioned history of recent diagnosis of recurrent DVT. She presents via telephone today for follow-up to discuss results of hypercoagulability work-up.  Except of factor V Leiden all of the test results have come back.  The tests have all been negative.  Based on this result, she would like to discontinue anticoagulation.  Observations/Objective:     Assessment Plan:  DVT (deep venous thrombosis) (HCC) Recurrent DVTs: On Eliquis for anticoagulation, long-term Original blood clot was in 2000 and subsequently 2 more episodes of popliteal vein DVTs (related to driving over long distances)   B12 deficiency: On B12 replacement therapy. Lab review: Hypercoagulability work-up: Factor V Leiden pending Rest of the work-up is negative including protein C, protein S, Antithrombin III, lupus anticoagulant, anticardiolipin antibody, antibeta-2 glycoprotein 1 antibody, prothrombin gene mutation: Negative   Patient's decision regarding anticoagulation: She is discontinuing Eliquis based upon negative results on hypercoagulability work-up.  At this point, she will not need any bridging because she is not going to be on any Eliquis anymore. If the factor V Leiden comes back positive, she may go back on Eliquis postoperatively. She fully understands that stopping anticoagulation  still carries some risk for blood clots and accepts those risks and is willing to proceed. Postoperatively, she will require DVT prevention/prophylaxis  Return to clinic on an as-needed basis.  I discussed the assessment and treatment plan with the patient. The patient was provided an opportunity to ask questions and all were answered. The patient agreed with the plan and demonstrated an understanding of the instructions. The patient was advised to call back or seek an in-person evaluation if the symptoms worsen or if the condition fails to improve as anticipated.   Total time spent: 12 mins including non-face to face time and time spent for planning, charting and coordination of care  Sabas Sous, MD 11/12/2020    I, Alda Ponder, am acting as scribe for Serena Croissant, MD.  I have reviewed the above documentation for accuracy and completeness, and I agree with the above.

## 2020-11-12 ENCOUNTER — Inpatient Hospital Stay (HOSPITAL_BASED_OUTPATIENT_CLINIC_OR_DEPARTMENT_OTHER): Payer: Managed Care, Other (non HMO) | Admitting: Hematology and Oncology

## 2020-11-12 ENCOUNTER — Encounter: Payer: Self-pay | Admitting: Hematology and Oncology

## 2020-11-12 DIAGNOSIS — I824Y9 Acute embolism and thrombosis of unspecified deep veins of unspecified proximal lower extremity: Secondary | ICD-10-CM

## 2020-11-12 LAB — FACTOR 5 LEIDEN

## 2020-11-12 LAB — PROTHROMBIN GENE MUTATION

## 2020-11-12 NOTE — Assessment & Plan Note (Signed)
Recurrent DVTs: On Eliquis for anticoagulation, long-term Original blood clot was in 2000 and subsequently 2 more episodes of popliteal vein DVTs (related to driving over long distances) Patient informed me that she has never had a hypercoagulability work-up. We will request blood work for inherited and somewhat acquired risk factors for thromboembolism.  B12 deficiency: On B12 replacement therapy. Lab review: Hypercoagulability work-up: Factor V Leiden and prothrombin gene mutation are pending Rest of the work-up is negative including protein C, protein S, Antithrombin III, lupus anticoagulant, anticardiolipin antibody, antibeta-2 glycoprotein 1 antibody  Patient is contemplating on discontinuing Eliquis if hypercoagulability work-up is negative.  However my recommendation was indefinite anticoagulation.  Recommendation regarding bridging for Lovenox: I recommended that she stop Eliquis 48 hours prior to surgery and take Lovenox injections twice a day for the 2 days and then on the day of surgery she does not receive any anticoagulation and Eliquis can be resumed postoperatively.  Return to clinic on an as-needed basis.

## 2020-11-13 ENCOUNTER — Telehealth: Payer: Self-pay | Admitting: Hematology and Oncology

## 2020-11-13 NOTE — Telephone Encounter (Signed)
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 She has already resumed Eliquis. She tells me that the gastric surgery has been postponed/canceled for now. Return to clinic on an as-needed basis.

## 2021-04-25 NOTE — Progress Notes (Signed)
Per pt, note needed in chart for the following medication bridge related to her surgery in June 2023: ? ? ?Per MD, 48 hours prior to surgery you will discontinue Eliquis and begin Lovenox.  Lovenox will be given every 12 hours for a total of 4 doses.  The day after your surgery you can resume Eliquis ?

## 2021-04-29 ENCOUNTER — Ambulatory Visit: Payer: Managed Care, Other (non HMO) | Admitting: Hematology and Oncology

## 2021-04-29 ENCOUNTER — Encounter: Payer: Self-pay | Admitting: *Deleted

## 2021-04-29 ENCOUNTER — Other Ambulatory Visit: Payer: Self-pay | Admitting: *Deleted

## 2021-04-29 MED ORDER — ENOXAPARIN SODIUM 120 MG/0.8ML IJ SOSY: 120.0000 mg | PREFILLED_SYRINGE | Freq: Two times a day (BID) | INTRAMUSCULAR | 0 refills | Status: AC

## 2021-07-22 ENCOUNTER — Encounter: Payer: Self-pay | Admitting: Hematology and Oncology

## 2022-01-15 ENCOUNTER — Other Ambulatory Visit: Payer: Self-pay | Admitting: Family Medicine

## 2022-01-15 DIAGNOSIS — I495 Sick sinus syndrome: Secondary | ICD-10-CM

## 2022-01-21 ENCOUNTER — Ambulatory Visit: Payer: Managed Care, Other (non HMO)

## 2022-01-21 DIAGNOSIS — I495 Sick sinus syndrome: Secondary | ICD-10-CM

## 2022-02-18 ENCOUNTER — Encounter: Payer: Self-pay | Admitting: Cardiology

## 2023-04-01 ENCOUNTER — Encounter (HOSPITAL_COMMUNITY): Payer: Managed Care, Other (non HMO)

## 2023-04-01 ENCOUNTER — Encounter: Payer: Managed Care, Other (non HMO) | Admitting: Vascular Surgery
